# Patient Record
Sex: Female | Born: 1965 | Race: White | Hispanic: No | Marital: Married | Smoking: Never smoker
Health system: Southern US, Community
[De-identification: ages and names within clinical notes are randomized; demographics above are authoritative.]

## PROBLEM LIST (undated history)

## (undated) DIAGNOSIS — G4730 Sleep apnea, unspecified: Secondary | ICD-10-CM

## (undated) DIAGNOSIS — R609 Edema, unspecified: Secondary | ICD-10-CM

## (undated) DIAGNOSIS — J302 Other seasonal allergic rhinitis: Secondary | ICD-10-CM

## (undated) DIAGNOSIS — R112 Nausea with vomiting, unspecified: Secondary | ICD-10-CM

## (undated) DIAGNOSIS — F419 Anxiety disorder, unspecified: Secondary | ICD-10-CM

## (undated) DIAGNOSIS — F32A Depression, unspecified: Secondary | ICD-10-CM

## (undated) DIAGNOSIS — Z9889 Other specified postprocedural states: Secondary | ICD-10-CM

## (undated) DIAGNOSIS — F329 Major depressive disorder, single episode, unspecified: Secondary | ICD-10-CM

## (undated) DIAGNOSIS — M1990 Unspecified osteoarthritis, unspecified site: Secondary | ICD-10-CM

## (undated) HISTORY — PX: MENISCECTOMY: SHX123

## (undated) HISTORY — PX: BREAST BIOPSY: SHX20

## (undated) HISTORY — PX: WISDOM TOOTH EXTRACTION: SHX21

## (undated) HISTORY — PX: NOVASURE ABLATION: SHX5394

---

## 2007-09-20 ENCOUNTER — Encounter: Admission: RE | Admit: 2007-09-20 | Discharge: 2007-09-20 | Payer: Self-pay | Admitting: Obstetrics and Gynecology

## 2008-03-21 ENCOUNTER — Encounter: Admission: RE | Admit: 2008-03-21 | Discharge: 2008-03-21 | Payer: Self-pay | Admitting: Obstetrics and Gynecology

## 2012-12-31 ENCOUNTER — Encounter (HOSPITAL_COMMUNITY): Payer: Self-pay | Admitting: Pharmacist

## 2013-01-11 ENCOUNTER — Other Ambulatory Visit: Payer: Self-pay

## 2013-01-11 ENCOUNTER — Encounter (HOSPITAL_COMMUNITY)
Admission: RE | Admit: 2013-01-11 | Discharge: 2013-01-11 | Disposition: A | Discharge status: Home / Self Care | Payer: BC Managed Care – PPO | Source: Ambulatory Visit | Attending: Obstetrics and Gynecology | Admitting: Obstetrics and Gynecology

## 2013-01-11 ENCOUNTER — Encounter (HOSPITAL_COMMUNITY): Payer: Self-pay

## 2013-01-11 HISTORY — DX: Depression, unspecified: F32.A

## 2013-01-11 HISTORY — DX: Major depressive disorder, single episode, unspecified: F32.9

## 2013-01-11 HISTORY — DX: Anxiety disorder, unspecified: F41.9

## 2013-01-11 HISTORY — DX: Other specified postprocedural states: Z98.890

## 2013-01-11 HISTORY — DX: Other specified postprocedural states: R11.2

## 2013-01-11 HISTORY — DX: Edema, unspecified: R60.9

## 2013-01-11 HISTORY — DX: Sleep apnea, unspecified: G47.30

## 2013-01-11 LAB — CBC
HCT: 40.2 % (ref 36.0–46.0)
MCHC: 33.6 g/dL (ref 30.0–36.0)
MCV: 78.4 fL (ref 78.0–100.0)
Platelets: 243 10*3/uL (ref 150–400)
RDW: 13.6 % (ref 11.5–15.5)

## 2013-01-11 LAB — BASIC METABOLIC PANEL
Chloride: 99 mEq/L (ref 96–112)
Potassium: 3.3 mEq/L — ABNORMAL LOW (ref 3.5–5.1)
Sodium: 135 mEq/L (ref 135–145)

## 2013-01-11 MED ORDER — DEXTROSE 5 % IV SOLN
2.0000 g | INTRAVENOUS | Status: AC
  Administered 2013-01-12: 2 g via INTRAVENOUS
  Filled 2013-01-11: qty 2

## 2013-01-11 NOTE — Patient Instructions (Signed)
Your procedure is scheduled on:01/12/13  Enter through the Main Entrance at :6am Pick up desk phone and dial [phone] and inform us of your arrival.  Please call [phone] if you have any problems the morning of surgery.  Remember: Do not eat or drink after midnight:tonight   Take these meds the morning of surgery with a sip of water: usual am meds  DO NOT wear jewelry, eye make-up, lipstick,body lotion, or dark fingernail polish. Do not shave for 48 hours prior to surgery.   Patients discharged on the day of surgery will not be allowed to drive home.

## 2013-01-11 NOTE — H&P (Addendum)
47 yo with menorrhagia and fibroids presents for surgical mngt  PMHx:  Anxiety PSHx:  c-section, SVD x 2 SHx:  Negative tobacco All:  None Meds: cymbalta, xanex prn FHx:  DM, kidney dz, arthritis  AF, VSS Gen - NAD CV - RRR Lungs - clear Abd - soft, NT, ND PV - uterus mobile, NT.  No adnexal masses  [place]: multiple fibroids.  2.9cm anterior fibroid protruding into endometrial cavity  A/P:  Hysteroscopy, D&C w/ resection of fibroid and endometrial ablation R/b/a discussed, informed consent

## 2013-01-12 ENCOUNTER — Encounter (HOSPITAL_COMMUNITY): Payer: Self-pay | Admitting: Anesthesiology

## 2013-01-12 ENCOUNTER — Ambulatory Visit (HOSPITAL_COMMUNITY)
Admission: RE | Admit: 2013-01-12 | Discharge: 2013-01-12 | Disposition: A | Discharge status: Home / Self Care | Payer: BC Managed Care – PPO | Source: Ambulatory Visit | Attending: Obstetrics and Gynecology | Admitting: Obstetrics and Gynecology

## 2013-01-12 ENCOUNTER — Ambulatory Visit (HOSPITAL_COMMUNITY): Payer: BC Managed Care – PPO | Admitting: Anesthesiology

## 2013-01-12 ENCOUNTER — Encounter (HOSPITAL_COMMUNITY)
Admission: RE | Disposition: A | Discharge status: Home / Self Care | Payer: Self-pay | Source: Ambulatory Visit | Attending: Obstetrics and Gynecology

## 2013-01-12 ENCOUNTER — Encounter (HOSPITAL_COMMUNITY): Payer: Self-pay | Admitting: General Practice

## 2013-01-12 DIAGNOSIS — N920 Excessive and frequent menstruation with regular cycle: Secondary | ICD-10-CM | POA: Insufficient documentation

## 2013-01-12 DIAGNOSIS — D259 Leiomyoma of uterus, unspecified: Secondary | ICD-10-CM | POA: Insufficient documentation

## 2013-01-12 SURGERY — DILATATION & CURETTAGE/HYSTEROSCOPY WITH TRUCLEAR
Anesthesia: General | Site: Uterus | Wound class: Clean Contaminated

## 2013-01-12 MED ORDER — LIDOCAINE HCL 1 % IJ SOLN
INTRAMUSCULAR | Status: DC | PRN
  Administered 2013-01-12: 10 mL

## 2013-01-12 MED ORDER — EPHEDRINE SULFATE 50 MG/ML IJ SOLN
INTRAMUSCULAR | Status: DC | PRN
  Administered 2013-01-12: 5 mg via INTRAVENOUS
  Administered 2013-01-12 (×2): 10 mg via INTRAVENOUS

## 2013-01-12 MED ORDER — METOCLOPRAMIDE HCL 5 MG/ML IJ SOLN
INTRAMUSCULAR | Status: AC
  Filled 2013-01-12: qty 2

## 2013-01-12 MED ORDER — DEXAMETHASONE SODIUM PHOSPHATE 10 MG/ML IJ SOLN
INTRAMUSCULAR | Status: DC | PRN
  Administered 2013-01-12: 10 mg via INTRAVENOUS

## 2013-01-12 MED ORDER — METOCLOPRAMIDE HCL 5 MG/ML IJ SOLN
INTRAMUSCULAR | Status: DC | PRN
  Administered 2013-01-12: 10 mg via INTRAVENOUS

## 2013-01-12 MED ORDER — HYDROCODONE-ACETAMINOPHEN 5-500 MG PO TABS: 1.0000 | ORAL_TABLET | ORAL | Status: DC | PRN

## 2013-01-12 MED ORDER — FENTANYL CITRATE 0.05 MG/ML IJ SOLN
INTRAMUSCULAR | Status: DC | PRN
  Administered 2013-01-12: 100 ug via INTRAVENOUS

## 2013-01-12 MED ORDER — LACTATED RINGERS IV SOLN
INTRAVENOUS | Status: DC
  Administered 2013-01-12 (×3): via INTRAVENOUS

## 2013-01-12 MED ORDER — FENTANYL CITRATE 0.05 MG/ML IJ SOLN
INTRAMUSCULAR | Status: AC
  Administered 2013-01-12: 25 ug via INTRAVENOUS
  Filled 2013-01-12: qty 2

## 2013-01-12 MED ORDER — PHENYLEPHRINE 40 MCG/ML (10ML) SYRINGE FOR IV PUSH (FOR BLOOD PRESSURE SUPPORT)
PREFILLED_SYRINGE | INTRAVENOUS | Status: AC
  Filled 2013-01-12: qty 5

## 2013-01-12 MED ORDER — ONDANSETRON HCL 4 MG/2ML IJ SOLN
INTRAMUSCULAR | Status: DC | PRN
  Administered 2013-01-12: 4 mg via INTRAVENOUS

## 2013-01-12 MED ORDER — KETOROLAC TROMETHAMINE 30 MG/ML IJ SOLN
INTRAMUSCULAR | Status: AC
  Administered 2013-01-12: 30 mg via INTRAVENOUS
  Filled 2013-01-12: qty 1

## 2013-01-12 MED ORDER — EPHEDRINE 5 MG/ML INJ
INTRAVENOUS | Status: AC
  Filled 2013-01-12: qty 10

## 2013-01-12 MED ORDER — PROPOFOL 10 MG/ML IV EMUL
INTRAVENOUS | Status: DC | PRN
  Administered 2013-01-12: 180 mg via INTRAVENOUS

## 2013-01-12 MED ORDER — IBUPROFEN 200 MG PO TABS: 600.0000 mg | ORAL_TABLET | Freq: Four times a day (QID) | ORAL | Status: DC | PRN

## 2013-01-12 MED ORDER — PHENYLEPHRINE HCL 10 MG/ML IJ SOLN
INTRAMUSCULAR | Status: DC | PRN
  Administered 2013-01-12: 120 ug via INTRAVENOUS
  Administered 2013-01-12: 80 ug via INTRAVENOUS

## 2013-01-12 MED ORDER — MIDAZOLAM HCL 5 MG/5ML IJ SOLN
INTRAMUSCULAR | Status: DC | PRN
  Administered 2013-01-12: 2 mg via INTRAVENOUS

## 2013-01-12 MED ORDER — LIDOCAINE HCL (CARDIAC) 20 MG/ML IV SOLN
INTRAVENOUS | Status: AC
  Filled 2013-01-12: qty 5

## 2013-01-12 MED ORDER — PROPOFOL 10 MG/ML IV EMUL
INTRAVENOUS | Status: AC
  Filled 2013-01-12: qty 20

## 2013-01-12 MED ORDER — ONDANSETRON HCL 4 MG/2ML IJ SOLN
INTRAMUSCULAR | Status: AC
  Filled 2013-01-12: qty 2

## 2013-01-12 MED ORDER — SCOPOLAMINE 1 MG/3DAYS TD PT72
MEDICATED_PATCH | TRANSDERMAL | Status: AC
  Administered 2013-01-12: 1 via TRANSDERMAL
  Filled 2013-01-12: qty 1

## 2013-01-12 MED ORDER — SODIUM CHLORIDE 0.9 % IR SOLN
Status: DC | PRN
  Administered 2013-01-12 (×9): 3000 mL

## 2013-01-12 MED ORDER — FENTANYL CITRATE 0.05 MG/ML IJ SOLN
25.0000 ug | INTRAMUSCULAR | Status: DC | PRN
  Administered 2013-01-12: 25 ug via INTRAVENOUS

## 2013-01-12 MED ORDER — KETOROLAC TROMETHAMINE 30 MG/ML IJ SOLN: 30.0000 mg | Freq: Once | INTRAMUSCULAR | Status: AC

## 2013-01-12 MED ORDER — MIDAZOLAM HCL 2 MG/2ML IJ SOLN
INTRAMUSCULAR | Status: AC
  Filled 2013-01-12: qty 2

## 2013-01-12 MED ORDER — LIDOCAINE HCL (CARDIAC) 20 MG/ML IV SOLN
INTRAVENOUS | Status: DC | PRN
  Administered 2013-01-12: 50 mg via INTRAVENOUS

## 2013-01-12 MED ORDER — DEXAMETHASONE SODIUM PHOSPHATE 10 MG/ML IJ SOLN
INTRAMUSCULAR | Status: AC
  Filled 2013-01-12: qty 1

## 2013-01-12 MED ORDER — FENTANYL CITRATE 0.05 MG/ML IJ SOLN
INTRAMUSCULAR | Status: AC
  Filled 2013-01-12: qty 5

## 2013-01-12 SURGICAL SUPPLY — 22 items
BLADE INCISOR TRUC PLUS 2.9 (ABLATOR) IMPLANT
CANISTERS HI-FLOW 3000CC (CANNISTER) ×16 IMPLANT
CATH ROBINSON RED A/P 16FR (CATHETERS) ×2 IMPLANT
CLOTH BEACON ORANGE TIMEOUT ST (SAFETY) ×2 IMPLANT
CONTAINER PREFILL 10% NBF 60ML (FORM) ×4 IMPLANT
DRAPE HYSTEROSCOPY (DRAPE) ×2 IMPLANT
DRESSING TELFA 8X3 (GAUZE/BANDAGES/DRESSINGS) ×2 IMPLANT
ELECT REM PT RETURN 9FT ADLT (ELECTROSURGICAL) ×2
ELECTRODE REM PT RTRN 9FT ADLT (ELECTROSURGICAL) ×1 IMPLANT
GLOVE BIO SURGEON STRL SZ 6.5 (GLOVE) ×2 IMPLANT
GLOVE BIOGEL PI IND STRL 7.0 (GLOVE) ×1 IMPLANT
GLOVE BIOGEL PI INDICATOR 7.0 (GLOVE) ×1
GOWN STRL REIN XL XLG (GOWN DISPOSABLE) ×4 IMPLANT
INCISOR TRUC PLUS BLADE 2.9 (ABLATOR)
KIT HYSTEROSCOPY TRUCLEAR (ABLATOR) ×2 IMPLANT
MORCELLATOR RECIP TRUCLEAR 4.0 (ABLATOR) ×4 IMPLANT
NEEDLE SPNL 22GX3.5 QUINCKE BK (NEEDLE) ×2 IMPLANT
PACK VAGINAL MINOR WOMEN LF (CUSTOM PROCEDURE TRAY) ×2 IMPLANT
PAD OB MATERNITY 4.3X12.25 (PERSONAL CARE ITEMS) ×2 IMPLANT
SYR CONTROL 10ML LL (SYRINGE) ×2 IMPLANT
TOWEL OR 17X24 6PK STRL BLUE (TOWEL DISPOSABLE) ×4 IMPLANT
WATER STERILE IRR 1000ML POUR (IV SOLUTION) ×2 IMPLANT

## 2013-01-12 NOTE — Anesthesia Postprocedure Evaluation (Signed)
Anesthesia Post Note  Patient: Angelica Morgan  Procedure(s) Performed: Procedure(s) (LRB): DILATATION & CURETTAGE/HYSTEROSCOPY WITH TRUCLEAR (N/A)  Anesthesia type: General  Patient location: PACU  Post pain: Pain level controlled  Post assessment: Post-op Vital signs reviewed  Last Vitals:  Filed Vitals:   01/12/13 0945  BP: 103/53  Pulse:   Temp:     Post vital signs: Reviewed  Level of consciousness: sedated  Complications: No apparent anesthesia complications

## 2013-01-12 NOTE — Transfer of Care (Signed)
Immediate Anesthesia Transfer of Care Note  Patient: Angelica Morgan  Procedure(s) Performed: Procedure(s) with comments: DILATATION & CURETTAGE/HYSTEROSCOPY WITH TRUCLEAR (N/A) - with novasure  Immediate Anesthesia Transfer of Care Note  Patient: Angelica Morgan  Procedure(s) Performed: Procedure(s) with comments: DILATATION & CURETTAGE/HYSTEROSCOPY WITH TRUCLEAR (N/A) - with novasure  Patient Location: PACU  Anesthesia Type:General  Level of Consciousness: awake  Airway & Oxygen Therapy: Patient Spontanous Breathing  Post-op Assessment: Report given to PACU RN  Post vital signs: stable  Filed Vitals:   01/12/13 0606  BP: 104/71  Pulse: 79  Temp: 36.7 C    Complications: No apparent anesthesia complications

## 2013-01-12 NOTE — Anesthesia Preprocedure Evaluation (Addendum)
Anesthesia Evaluation  Patient identified by MRN, date of birth, ID band Patient awake    Reviewed: Allergy & Precautions, H&P , Patient's Chart, lab work & pertinent test results, reviewed documented beta blocker date and time   Airway Mallampati: II TM Distance: >3 FB Neck ROM: full    Dental no notable dental hx.    Pulmonary  breath sounds clear to auscultation  Pulmonary exam normal       Cardiovascular Rhythm:regular Rate:Normal     Neuro/Psych    GI/Hepatic   Endo/Other  Morbid obesity  Renal/GU      Musculoskeletal   Abdominal   Peds  Hematology   Anesthesia Other Findings No Dx of OSA; only snores w/o other sx of OSA  Reproductive/Obstetrics                          Anesthesia Physical Anesthesia Plan  ASA: III  Anesthesia Plan: General   Post-op Pain Management:    Induction: Intravenous  Airway Management Planned: LMA  Additional Equipment:   Intra-op Plan:   Post-operative Plan:   Informed Consent: I have reviewed the patients History and Physical, chart, labs and discussed the procedure including the risks, benefits and alternatives for the proposed anesthesia with the patient or authorized representative who has indicated his/her understanding and acceptance.   Dental Advisory Given  Plan Discussed with: CRNA and Surgeon  Anesthesia Plan Comments: (  Discussed  general anesthesia, including possible nausea, instrumentation of airway, sore throat,pulmonary aspiration, etc. I asked if the were any outstanding questions, or  concerns before we proceeded. )        Anesthesia Quick Evaluation

## 2013-01-12 NOTE — Op Note (Signed)
01/12/2013  9:39 AM  PATIENT:  Angelica Morgan  47 y.o. female  PRE-OPERATIVE DIAGNOSIS:  menorrhagia,fibroids cpt [phone]  POST-OPERATIVE DIAGNOSIS:  fibroids, menorrghia  PROCEDURE:  Procedure(s) with comments: DILATATION & CURETTAGE/HYSTEROSCOPY WITH TRUCLEAR (N/A) - with novasure  SURGEON:  Surgeon(s) and Role:    * Zelphia Cairo, MD - Primary     ANESTHESIA:   general  EBL:  Total I/O In: 1500 [I.V.:1500] Out: 110 [Urine:100; Blood:10]  Fluid deficit (NS):  1000cc  BLOOD ADMINISTERED:none  DRAINS: none   LOCAL MEDICATIONS USED:  LIDOCAINE   SPECIMEN:  Source of Specimen:  submucus fibroid  DISPOSITION OF SPECIMEN:  PATHOLOGY  COUNTS:  YES  TOURNIQUET:  * No tourniquets in log *  DICTATION: .Other Dictation: Dictation Number pending  PLAN OF CARE: Discharge to home after PACU  PATIENT DISPOSITION:  PACU - hemodynamically stable.   Delay start of Pharmacological VTE agent (>24hrs) due to surgical blood loss or risk of bleeding: no

## 2013-01-19 NOTE — Op Note (Signed)
NAMERACHELLE, EDWARDS NO.:  [id number]  MEDICAL RECORD NO.:  [id number]  LOCATION:  [facility]                          FACILITY:  [facility]  PHYSICIAN:  Zelphia Cairo, MD    DATE OF BIRTH:  05/12/1966  DATE OF PROCEDURE: DATE OF DISCHARGE:  01/12/2013                              OPERATIVE REPORT   PREOPERATIVE DIAGNOSES: 1. Menorrhagia. 2. Endometrial mass.  POSTOPERATIVE DIAGNOSES: 1. Menorrhagia. 2. Endometrial mass, pathology pending.  PROCEDURES: 1. Cervical block. 2. Hysteroscopy. 3. D and C. 4. Resection of endometrial mass.  SURGEON:  Zelphia Cairo, MD  ANESTHESIA:  General.  COMPLICATIONS:  None.  CONDITION: Stable to recovery room.  PROCEDURE:  The patient was taken to the operating room after informed consent was obtained.  She was placed in the dorsal lithotomy position using Allen stirrups.  She was prepped and draped in sterile fashion, and an in-and-out catheter was used to drain her bladder.  Bivalve speculum was placed in the vagina and a single-tooth tenaculum attached to the anterior lip of the cervix.  The cervical block was performed. The hysteroscope was inserted into the endometrial cavity and a survey was performed.  Large endometrial mass was noted to consume the entire endometrial cavity.  The mass did appear vascular and consistent with a submucous fibroid.  TRUCLEAR device was inserted through the hysteroscope and resection was performed.  The fibroid was very calcified and firm and the blade had difficulty with resection because the mass kept bouncing off of the blade.  As fluid deficit increased, we had to discontinue the procedure before the entire mass could be resected and therefore, endometrial ablation could not be performed.  All instruments were then removed from the uterus and the cervix.  The cervix was hemostatic.  The patient was taken to the recovery room in stable condition.  Sponge, lap, needle, and  instrument counts were correct x2.     Zelphia Cairo, MD     GA/MEDQ  D:  01/18/2013  T:  01/19/2013  Job:  [id number]

## 2013-04-06 ENCOUNTER — Encounter (HOSPITAL_COMMUNITY)
Admission: RE | Admit: 2013-04-06 | Discharge: 2013-04-06 | Disposition: A | Discharge status: Home / Self Care | Payer: BC Managed Care – PPO | Source: Ambulatory Visit | Attending: Obstetrics and Gynecology | Admitting: Obstetrics and Gynecology

## 2013-04-06 ENCOUNTER — Encounter (HOSPITAL_COMMUNITY): Payer: Self-pay

## 2013-04-06 DIAGNOSIS — Z01812 Encounter for preprocedural laboratory examination: Secondary | ICD-10-CM | POA: Insufficient documentation

## 2013-04-06 DIAGNOSIS — Z01818 Encounter for other preprocedural examination: Secondary | ICD-10-CM | POA: Insufficient documentation

## 2013-04-06 HISTORY — DX: Other seasonal allergic rhinitis: J30.2

## 2013-04-06 LAB — BASIC METABOLIC PANEL
BUN: 15 mg/dL (ref 6–23)
Chloride: 98 mEq/L (ref 96–112)
GFR calc Af Amer: >90 mL/min (ref >90)
Potassium: 3.2 mEq/L — ABNORMAL LOW (ref 3.5–5.1)
Sodium: 134 mEq/L — ABNORMAL LOW (ref 135–145)

## 2013-04-06 LAB — CBC
MCV: 78.3 fL (ref 78.0–100.0)
Platelets: 226 10*3/uL (ref 150–400)
RBC: 5.2 MIL/uL — ABNORMAL HIGH (ref 3.87–5.11)
WBC: 7.9 10*3/uL (ref 4.0–10.5)

## 2013-04-06 LAB — SURGICAL PCR SCREEN: Staphylococcus aureus: NEGATIVE

## 2013-04-06 NOTE — Patient Instructions (Addendum)
   Your procedure is scheduled on:  Wednesday, May 28  Enter through the Main Entrance of [facility] at: 6 am Pick up the phone at the desk and dial [phone] and inform us of your arrival.  Please call this number if you have any problems the morning of surgery: [phone]  Remember: Do not eat or drink after midnight:Tuesday. Take these medicines the morning of surgery with a SIP OF WATER:  Xanax, cymbalta, XYZA  With sip of water on day of surgery.  Do not take HCTZ per Dr Malen Gauze.  Do not wear jewelry, make-up, or FINGER nail polish No metal in your hair or on your body. Do not wear lotions, powders, perfumes. You may wear deodorant.  Please use your CHG wash as directed prior to surgery.  Do not shave anywhere for at least 12 hours prior to first CHG shower.  Do not bring valuables to the hospital. Contacts, dentures or bridgework may not be worn into surgery.  Leave suitcase in the car. After Surgery it may be brought to your room. For patients being admitted to the hospital, checkout time is 11:00am the day of discharge.  Patients discharged on the day of surgery will not be allowed to drive home.  Home with husband Ty.

## 2013-04-06 NOTE — H&P (Addendum)
47 yo with menorrhagia and fibroids presents for surgical mngt.  Pt underwent hysteroscoy w/ partial resection of submucus fibroid.  Symptoms have not improved and she declines another trial of resetion - desires definitive mngt.  PMHx: Anxiety  PSHx: c-section, SVD x 2, hysteroscopy w/ trueclear resection SHx: Negative tobacco  All: None  Meds: cymbalta, xanex prn, HCTZ, magnesium, green tea pill,  Allergy med FHx: DM, kidney dz, arthritis  AF, VSS  Gen - NAD  CV - RRR  Lungs - clear  Abd - soft, NT, ND  PV - uterus mobile, NT. No adnexal masses  [place]: multiple fibroids. 2.9cm anterior fibroid protruding into endometrial cavity  A/P: menorrhagia - plan for LAVH R/b/a discussed, informed consent Plan of care reviewed, questions answered. Informed consent

## 2013-04-12 MED ORDER — DEXTROSE 5 % IV SOLN
2.0000 g | INTRAVENOUS | Status: AC
  Administered 2013-04-13: 2 g via INTRAVENOUS
  Filled 2013-04-12: qty 2

## 2013-04-13 ENCOUNTER — Ambulatory Visit (HOSPITAL_COMMUNITY): Payer: BC Managed Care – PPO | Admitting: Anesthesiology

## 2013-04-13 ENCOUNTER — Encounter (HOSPITAL_COMMUNITY): Payer: Self-pay | Admitting: Anesthesiology

## 2013-04-13 ENCOUNTER — Encounter (HOSPITAL_COMMUNITY)
Admission: RE | Disposition: A | Discharge status: Home / Self Care | Payer: Self-pay | Source: Ambulatory Visit | Attending: Obstetrics and Gynecology

## 2013-04-13 ENCOUNTER — Observation Stay (HOSPITAL_COMMUNITY)
Admission: RE | Admit: 2013-04-13 | Discharge: 2013-04-14 | Disposition: A | Discharge status: Home / Self Care | Payer: BC Managed Care – PPO | Source: Ambulatory Visit | Attending: Obstetrics and Gynecology | Admitting: Obstetrics and Gynecology

## 2013-04-13 DIAGNOSIS — D250 Submucous leiomyoma of uterus: Secondary | ICD-10-CM | POA: Insufficient documentation

## 2013-04-13 DIAGNOSIS — N920 Excessive and frequent menstruation with regular cycle: Principal | ICD-10-CM | POA: Insufficient documentation

## 2013-04-13 HISTORY — PX: LAPAROSCOPIC ASSISTED VAGINAL HYSTERECTOMY: SHX5398

## 2013-04-13 LAB — BASIC METABOLIC PANEL
CO2: 24 mEq/L (ref 19–32)
Chloride: 102 mEq/L (ref 96–112)
Potassium: 3.5 mEq/L (ref 3.5–5.1)
Sodium: 136 mEq/L (ref 135–145)

## 2013-04-13 SURGERY — HYSTERECTOMY, VAGINAL, LAPAROSCOPY-ASSISTED
Anesthesia: General | Site: Abdomen | Wound class: Clean Contaminated

## 2013-04-13 MED ORDER — PROMETHAZINE HCL 25 MG/ML IJ SOLN: 6.2500 mg | INTRAMUSCULAR | Status: DC | PRN

## 2013-04-13 MED ORDER — FENTANYL CITRATE 0.05 MG/ML IJ SOLN
INTRAMUSCULAR | Status: AC
  Filled 2013-04-13: qty 5

## 2013-04-13 MED ORDER — HYDROMORPHONE HCL PF 1 MG/ML IJ SOLN
INTRAMUSCULAR | Status: AC
  Administered 2013-04-13: 0.5 mg via INTRAVENOUS
  Filled 2013-04-13: qty 1

## 2013-04-13 MED ORDER — OXYCODONE-ACETAMINOPHEN 5-325 MG PO TABS
1.0000 | ORAL_TABLET | ORAL | Status: DC | PRN
  Filled 2013-04-13: qty 2
  Filled 2013-04-13: qty 1
  Filled 2013-04-13: qty 2

## 2013-04-13 MED ORDER — FENTANYL CITRATE 0.05 MG/ML IJ SOLN
INTRAMUSCULAR | Status: AC
  Filled 2013-04-13: qty 2

## 2013-04-13 MED ORDER — ONDANSETRON HCL 4 MG PO TABS: 4.0000 mg | ORAL_TABLET | Freq: Four times a day (QID) | ORAL | Status: DC | PRN

## 2013-04-13 MED ORDER — KETOROLAC TROMETHAMINE 30 MG/ML IJ SOLN: 15.0000 mg | Freq: Once | INTRAMUSCULAR | Status: DC | PRN

## 2013-04-13 MED ORDER — MEPERIDINE HCL 25 MG/ML IJ SOLN: 6.2500 mg | INTRAMUSCULAR | Status: DC | PRN

## 2013-04-13 MED ORDER — SCOPOLAMINE 1 MG/3DAYS TD PT72
1.0000 | MEDICATED_PATCH | TRANSDERMAL | Status: DC
  Administered 2013-04-13: 1.5 mg via TRANSDERMAL

## 2013-04-13 MED ORDER — LIDOCAINE HCL (CARDIAC) 20 MG/ML IV SOLN
INTRAVENOUS | Status: AC
  Filled 2013-04-13: qty 5

## 2013-04-13 MED ORDER — ONDANSETRON HCL 4 MG/2ML IJ SOLN
INTRAMUSCULAR | Status: AC
  Filled 2013-04-13: qty 2

## 2013-04-13 MED ORDER — PROPOFOL 10 MG/ML IV EMUL
INTRAVENOUS | Status: AC
  Filled 2013-04-13: qty 20

## 2013-04-13 MED ORDER — 0.9 % SODIUM CHLORIDE (POUR BTL) OPTIME
TOPICAL | Status: DC | PRN
  Administered 2013-04-13: 1000 mL

## 2013-04-13 MED ORDER — DULOXETINE HCL 30 MG PO CPEP
30.0000 mg | ORAL_CAPSULE | Freq: Every day | ORAL | Status: DC
  Filled 2013-04-13: qty 1

## 2013-04-13 MED ORDER — ONDANSETRON HCL 4 MG/2ML IJ SOLN: 4.0000 mg | Freq: Four times a day (QID) | INTRAMUSCULAR | Status: DC | PRN

## 2013-04-13 MED ORDER — KETOROLAC TROMETHAMINE 30 MG/ML IJ SOLN
INTRAMUSCULAR | Status: AC
  Filled 2013-04-13: qty 1

## 2013-04-13 MED ORDER — HYDROMORPHONE HCL PF 1 MG/ML IJ SOLN
1.0000 mg | INTRAMUSCULAR | Status: DC | PRN
  Administered 2013-04-13 (×3): 1 mg via INTRAVENOUS
  Filled 2013-04-13 (×3): qty 1

## 2013-04-13 MED ORDER — HYDROCHLOROTHIAZIDE 25 MG PO TABS
25.0000 mg | ORAL_TABLET | Freq: Every day | ORAL | Status: DC
  Filled 2013-04-13 (×2): qty 1

## 2013-04-13 MED ORDER — ROCURONIUM BROMIDE 100 MG/10ML IV SOLN
INTRAVENOUS | Status: DC | PRN
  Administered 2013-04-13: 10 mg via INTRAVENOUS
  Administered 2013-04-13: 5 mg via INTRAVENOUS
  Administered 2013-04-13: 10 mg via INTRAVENOUS
  Administered 2013-04-13: 45 mg via INTRAVENOUS
  Administered 2013-04-13: 10 mg via INTRAVENOUS

## 2013-04-13 MED ORDER — ONDANSETRON HCL 4 MG/2ML IJ SOLN
INTRAMUSCULAR | Status: DC | PRN
  Administered 2013-04-13: 4 mg via INTRAVENOUS

## 2013-04-13 MED ORDER — KETOROLAC TROMETHAMINE 30 MG/ML IJ SOLN
INTRAMUSCULAR | Status: DC | PRN
  Administered 2013-04-13: 30 mg via INTRAMUSCULAR

## 2013-04-13 MED ORDER — GLYCOPYRROLATE 0.2 MG/ML IJ SOLN
INTRAMUSCULAR | Status: DC | PRN
  Administered 2013-04-13: .6 mg via INTRAVENOUS

## 2013-04-13 MED ORDER — GLYCOPYRROLATE 0.2 MG/ML IJ SOLN
INTRAMUSCULAR | Status: AC
  Filled 2013-04-13: qty 2

## 2013-04-13 MED ORDER — DEXAMETHASONE SODIUM PHOSPHATE 4 MG/ML IJ SOLN
INTRAMUSCULAR | Status: DC | PRN
  Administered 2013-04-13: 10 mg via INTRAVENOUS

## 2013-04-13 MED ORDER — MIDAZOLAM HCL 5 MG/5ML IJ SOLN
INTRAMUSCULAR | Status: DC | PRN
  Administered 2013-04-13: 2 mg via INTRAVENOUS

## 2013-04-13 MED ORDER — SCOPOLAMINE 1 MG/3DAYS TD PT72
MEDICATED_PATCH | TRANSDERMAL | Status: AC
  Filled 2013-04-13: qty 1

## 2013-04-13 MED ORDER — LIDOCAINE HCL (CARDIAC) 20 MG/ML IV SOLN
INTRAVENOUS | Status: DC | PRN
  Administered 2013-04-13: 80 mg via INTRAVENOUS

## 2013-04-13 MED ORDER — PROPOFOL 10 MG/ML IV BOLUS
INTRAVENOUS | Status: DC | PRN
  Administered 2013-04-13: 200 mg via INTRAVENOUS

## 2013-04-13 MED ORDER — NEOSTIGMINE METHYLSULFATE 1 MG/ML IJ SOLN
INTRAMUSCULAR | Status: AC
  Filled 2013-04-13: qty 1

## 2013-04-13 MED ORDER — BUPIVACAINE HCL (PF) 0.25 % IJ SOLN
INTRAMUSCULAR | Status: DC | PRN
  Administered 2013-04-13: 5 mL

## 2013-04-13 MED ORDER — BUPIVACAINE HCL (PF) 0.25 % IJ SOLN
INTRAMUSCULAR | Status: AC
  Filled 2013-04-13: qty 30

## 2013-04-13 MED ORDER — LACTATED RINGERS IR SOLN
Status: DC | PRN
  Administered 2013-04-13: 3000 mL

## 2013-04-13 MED ORDER — KETOROLAC TROMETHAMINE 30 MG/ML IJ SOLN
30.0000 mg | Freq: Three times a day (TID) | INTRAMUSCULAR | Status: DC
  Administered 2013-04-13 – 2013-04-14 (×2): 30 mg via INTRAVENOUS
  Filled 2013-04-13 (×2): qty 1

## 2013-04-13 MED ORDER — GLYCOPYRROLATE 0.2 MG/ML IJ SOLN
INTRAMUSCULAR | Status: AC
  Filled 2013-04-13: qty 1

## 2013-04-13 MED ORDER — IBUPROFEN 600 MG PO TABS
600.0000 mg | ORAL_TABLET | Freq: Four times a day (QID) | ORAL | Status: DC | PRN
  Administered 2013-04-14: 600 mg via ORAL
  Filled 2013-04-13: qty 1

## 2013-04-13 MED ORDER — ROCURONIUM BROMIDE 50 MG/5ML IV SOLN
INTRAVENOUS | Status: AC
  Filled 2013-04-13: qty 1

## 2013-04-13 MED ORDER — DEXTROSE IN LACTATED RINGERS 5 % IV SOLN
INTRAVENOUS | Status: DC
  Administered 2013-04-13: 19:00:00 via INTRAVENOUS

## 2013-04-13 MED ORDER — MIDAZOLAM HCL 2 MG/2ML IJ SOLN
INTRAMUSCULAR | Status: AC
  Filled 2013-04-13: qty 2

## 2013-04-13 MED ORDER — DEXAMETHASONE SODIUM PHOSPHATE 10 MG/ML IJ SOLN
INTRAMUSCULAR | Status: AC
  Filled 2013-04-13: qty 1

## 2013-04-13 MED ORDER — NEOSTIGMINE METHYLSULFATE 1 MG/ML IJ SOLN
INTRAMUSCULAR | Status: DC | PRN
  Administered 2013-04-13: 3 mg via INTRAVENOUS

## 2013-04-13 MED ORDER — OXYCODONE-ACETAMINOPHEN 5-325 MG PO TABS
1.0000 | ORAL_TABLET | ORAL | Status: DC | PRN
  Administered 2013-04-13: 2 via ORAL
  Administered 2013-04-14: 1 via ORAL
  Administered 2013-04-14: 2 via ORAL

## 2013-04-13 MED ORDER — LACTATED RINGERS IV SOLN
INTRAVENOUS | Status: DC
  Administered 2013-04-13 (×4): via INTRAVENOUS

## 2013-04-13 MED ORDER — FENTANYL CITRATE 0.05 MG/ML IJ SOLN
INTRAMUSCULAR | Status: DC | PRN
  Administered 2013-04-13 (×5): 50 ug via INTRAVENOUS

## 2013-04-13 MED ORDER — HYDROMORPHONE HCL PF 1 MG/ML IJ SOLN
0.2500 mg | INTRAMUSCULAR | Status: DC | PRN
  Administered 2013-04-13: 0.5 mg via INTRAVENOUS

## 2013-04-13 MED ORDER — KETOROLAC TROMETHAMINE 30 MG/ML IJ SOLN: 30.0000 mg | Freq: Three times a day (TID) | INTRAMUSCULAR | Status: DC

## 2013-04-13 MED ORDER — MENTHOL 3 MG MT LOZG: 1.0000 | LOZENGE | OROMUCOSAL | Status: DC | PRN

## 2013-04-13 SURGICAL SUPPLY — 35 items
CABLE HIGH FREQUENCY MONO STRZ (ELECTRODE) IMPLANT
CANISTER SUCTION 2500CC (MISCELLANEOUS) ×2 IMPLANT
CHLORAPREP W/TINT 26ML (MISCELLANEOUS) ×4 IMPLANT
CLOTH BEACON ORANGE TIMEOUT ST (SAFETY) ×2 IMPLANT
COVER TABLE BACK 60X90 (DRAPES) ×2 IMPLANT
DECANTER SPIKE VIAL GLASS SM (MISCELLANEOUS) ×2 IMPLANT
DERMABOND ADVANCED (GAUZE/BANDAGES/DRESSINGS) ×2
DERMABOND ADVANCED .7 DNX12 (GAUZE/BANDAGES/DRESSINGS) ×2 IMPLANT
ELECT REM PT RETURN 9FT ADLT (ELECTROSURGICAL) ×2
ELECTRODE REM PT RTRN 9FT ADLT (ELECTROSURGICAL) ×1 IMPLANT
GLOVE BIO SURGEON STRL SZ 6.5 (GLOVE) ×6 IMPLANT
GLOVE BIOGEL PI IND STRL 6.5 (GLOVE) ×2 IMPLANT
GLOVE BIOGEL PI IND STRL 7.0 (GLOVE) ×2 IMPLANT
GLOVE BIOGEL PI INDICATOR 6.5 (GLOVE) ×2
GLOVE BIOGEL PI INDICATOR 7.0 (GLOVE) ×2
GOWN STRL REIN XL XLG (GOWN DISPOSABLE) ×10 IMPLANT
NS IRRIG 1000ML POUR BTL (IV SOLUTION) ×2 IMPLANT
PACK LAVH (CUSTOM PROCEDURE TRAY) ×2 IMPLANT
PROTECTOR NERVE ULNAR (MISCELLANEOUS) ×2 IMPLANT
SEALER TISSUE G2 CVD JAW 45CM (ENDOMECHANICALS) ×2 IMPLANT
SET IRRIG TUBING LAPAROSCOPIC (IRRIGATION / IRRIGATOR) ×2 IMPLANT
SUT MNCRL 0 MO-4 VIOLET 18 CR (SUTURE) ×2 IMPLANT
SUT MON AB 2-0 CT1 36 (SUTURE) ×2 IMPLANT
SUT MONOCRYL 0 MO 4 18  CR/8 (SUTURE) ×2
SUT VIC AB 3-0 PS2 18 (SUTURE) ×1
SUT VIC AB 3-0 PS2 18XBRD (SUTURE) ×1 IMPLANT
SUT VICRYL 0 TIES 12 18 (SUTURE) ×2 IMPLANT
SUT VICRYL 0 UR6 27IN ABS (SUTURE) ×2 IMPLANT
TOWEL OR 17X24 6PK STRL BLUE (TOWEL DISPOSABLE) ×4 IMPLANT
TRAY FOLEY CATH 14FR (SET/KITS/TRAYS/PACK) ×2 IMPLANT
TROCAR OPTI TIP 5M 100M (ENDOMECHANICALS) ×2 IMPLANT
TROCAR XCEL NON-BLD 11X100MML (ENDOMECHANICALS) ×2 IMPLANT
TROCAR XCEL OPT SLVE 5M 100M (ENDOMECHANICALS) IMPLANT
WARMER LAPAROSCOPE (MISCELLANEOUS) ×2 IMPLANT
WATER STERILE IRR 1000ML POUR (IV SOLUTION) IMPLANT

## 2013-04-13 NOTE — Anesthesia Preprocedure Evaluation (Signed)
Anesthesia Evaluation  Patient identified by MRN, date of birth, ID band Patient awake    Reviewed: Allergy & Precautions, H&P , NPO status , Patient's Chart, lab work & pertinent test results  History of Anesthesia Complications (+) PONV  Airway Mallampati: II TM Distance: >3 FB Neck ROM: full    Dental no notable dental hx. (+) Teeth Intact   Pulmonary neg pulmonary ROS,    Pulmonary exam normal       Cardiovascular negative cardio ROS      Neuro/Psych negative neurological ROS  negative psych ROS   GI/Hepatic negative GI ROS, Neg liver ROS,   Endo/Other  negative endocrine ROS  Renal/GU negative Renal ROS  negative genitourinary   Musculoskeletal negative musculoskeletal ROS (+)   Abdominal Normal abdominal exam  (+)   Peds negative pediatric ROS (+)  Hematology negative hematology ROS (+)   Anesthesia Other Findings   Reproductive/Obstetrics negative OB ROS                           Anesthesia Physical Anesthesia Plan  ASA: II  Anesthesia Plan: General   Post-op Pain Management:    Induction: Intravenous  Airway Management Planned: Oral ETT  Additional Equipment:   Intra-op Plan:   Post-operative Plan: Extubation in OR  Informed Consent: I have reviewed the patients History and Physical, chart, labs and discussed the procedure including the risks, benefits and alternatives for the proposed anesthesia with the patient or authorized representative who has indicated his/her understanding and acceptance.   Dental Advisory Given  Plan Discussed with: CRNA and Surgeon  Anesthesia Plan Comments:         Anesthesia Quick Evaluation

## 2013-04-13 NOTE — Progress Notes (Signed)
Pt was evaluated at noon.  Pain was controlled with IV meds.  VS, I/O reviewed Gen - sedated Abd - soft, NT/ND Inc - c/d/i  A/P  Continue postop care

## 2013-04-13 NOTE — Transfer of Care (Signed)
Immediate Anesthesia Transfer of Care Note  Patient: Angelica Morgan  Procedure(s) Performed: Procedure(s): LAPAROSCOPIC ASSISTED VAGINAL HYSTERECTOMY (N/A)  Patient Location: PACU  Anesthesia Type:General  Level of Consciousness: awake, alert , oriented and patient cooperative  Airway & Oxygen Therapy: Patient Spontanous Breathing and Patient connected to nasal cannula oxygen  Post-op Assessment: Report given to PACU RN and Post -op Vital signs reviewed and stable  Post vital signs: Reviewed and stable  Complications: No apparent anesthesia complications

## 2013-04-13 NOTE — Preoperative (Signed)
Beta Blockers   Reason not to administer Beta Blockers:Not Applicable 

## 2013-04-14 ENCOUNTER — Encounter (HOSPITAL_COMMUNITY): Payer: Self-pay | Admitting: Obstetrics and Gynecology

## 2013-04-14 LAB — CBC
MCHC: 32.1 g/dL (ref 30.0–36.0)
Platelets: 214 10*3/uL (ref 150–400)
RDW: 14.2 % (ref 11.5–15.5)

## 2013-04-14 LAB — BASIC METABOLIC PANEL
GFR calc Af Amer: >90 mL/min (ref >90)
GFR calc non Af Amer: >90 mL/min (ref >90)
Potassium: 4.5 mEq/L (ref 3.5–5.1)
Sodium: 139 mEq/L (ref 135–145)

## 2013-04-14 MED ORDER — OXYCODONE-ACETAMINOPHEN 5-325 MG PO TABS: 1.0000 | ORAL_TABLET | ORAL | Status: DC | PRN

## 2013-04-14 MED ORDER — IBUPROFEN 600 MG PO TABS: 600.0000 mg | ORAL_TABLET | Freq: Four times a day (QID) | ORAL | Status: DC | PRN

## 2013-04-14 NOTE — Discharge Summary (Signed)
Physician Discharge Summary  Patient ID: Angelica Morgan MRN: [id number] DOB/AGE: Dec 21, 1965 47 y.o.  Admit date: 04/13/2013 Discharge date: 04/14/2013  Admission Diagnoses:  Menorrhagia, fibroids  Discharge Diagnoses:  Active Problems:   * No active hospital problems. *   Discharged Condition: good  Hospital Course: Pt was admitted for postop care.  Initially her pain was controlled with IV pain meds.  When she was able to tolerate PO, her diet was advanced and she was able to tolerate PO pain meds.  Her foley catheter was discontinued and she was ambulating and urinating without difficulty.  She remained afebrile and postop Hgb was stable.  She was d/c home on POD 1 with prescriptions for percocet and motrin  Consults: None  Significant Diagnostic Studies: labs: CBC  Treatments: IV hydration  Discharge Exam: Blood pressure 117/69, pulse 62, temperature 98.5 F (36.9 C), temperature source Oral, resp. rate 18, height 5\' 5"  (1.651 m), weight 101.152 kg (223 lb), SpO2 99.00%. General appearance: alert and cooperative Resp: clear to auscultation bilaterally Cardio: regular rate and rhythm GI: soft, non-tender; bowel sounds normal; no masses,  no organomegaly Incision/Wound:  Clean and intact  Disposition: 01-Home or Self Care     Medication List    STOP taking these medications       ALPRAZolam 0.5 MG tablet  Commonly known as:  XANAX     zolpidem 5 MG tablet  Commonly known as:  AMBIEN      TAKE these medications       Cranberry 450 MG Caps  Take 1 capsule by mouth 2 (two) times daily.     DULoxetine 30 MG capsule  Commonly known as:  CYMBALTA  Take 30 mg by mouth daily.     Glucosamine 500 MG Caps  Take 1 capsule by mouth daily.     Green Tea 200 MG Caps  Take 2 capsules by mouth 2 (two) times daily.     hydrochlorothiazide 25 MG tablet  Commonly known as:  HYDRODIURIL  Take 25 mg by mouth daily.     ibuprofen 600 MG tablet  Commonly known as:   ADVIL,MOTRIN  Take 1 tablet (600 mg total) by mouth every 6 (six) hours as needed.     levocetirizine 5 MG tablet  Commonly known as:  XYZAL  Take 5 mg by mouth every evening.     multivitamin with minerals Tabs  Take 1 tablet by mouth daily.     oxyCODONE-acetaminophen 5-325 MG per tablet  Commonly known as:  PERCOCET/ROXICET  Take 1-2 tablets by mouth every 4 (four) hours as needed.           Follow-up Information   Schedule an appointment as soon as possible for a visit in 2 weeks to follow up.      Signed: [name] 04/14/2013, 8:18 AM

## 2013-04-14 NOTE — Anesthesia Postprocedure Evaluation (Signed)
  Anesthesia Post-op Note  Patient: Angelica Morgan  Procedure(s) Performed: Procedure(s): LAPAROSCOPIC ASSISTED VAGINAL HYSTERECTOMY (N/A) Patient is awake and responsive. Pain and nausea are reasonably well controlled. Vital signs are stable and clinically acceptable. Oxygen saturation is clinically acceptable. There are no apparent anesthetic complications at this time. Patient is ready for discharge.

## 2013-04-29 NOTE — Op Note (Signed)
Angelica Morgan, Angelica Morgan               ACCOUNT NO.:  [id number]  MEDICAL RECORD NO.:  [id number]  LOCATION:  9302                          FACILITY:  [facility]  PHYSICIAN:  Zelphia Cairo, MD    DATE OF BIRTH:  12-Aug-1966  DATE OF PROCEDURE:  04/13/2013 DATE OF DISCHARGE:  04/14/2013                              OPERATIVE REPORT   PREOPERATIVE DIAGNOSIS:  Menorrhagia.  POSTOPERATIVE DIAGNOSIS:  Menorrhagia.  PROCEDURE:  Laparoscopic assisted vaginal hysterectomy.  SURGEON:  Zelphia Cairo, MD.  ASSISTANT:  Juluis Mire, M.D..  SPECIMEN:  Uterus with cervix.  COMPLICATIONS:  None.  ANESTHESIA:  General.  CONDITION:  Stable to recovery room.  DESCRIPTION OF PROCEDURE:  The patient was taken to the operating room. After informed consent was obtained.  She was given general anesthesia. Placed in the dorsal lithotomy position using Allen stirrups.  She was prepped and draped in sterile fashion and a Foley catheter was then inserted.  Bivalve speculum was placed in the vagina, single-tooth tenaculum placed on anterior lip of the cervix.  The Hulka clamp was placed on the cervix.  Tenaculum and speculum was removed and our attention was turned to the abdomen.  Local anesthesia was used at the site of our infraumbilical and suprapubic incisions.  Infraumbilical skin incision was then made with a scalpel and extended bluntly to the level of the fascia using a Kelly clamp.  Optical trocar was then inserted under direct visualization.  Once intraperitoneal placement was confirmed, CO2 was turned on and a survey of the abdomen and pelvis was performed.  The uterus was then manipulated to the patient's left side. Right adnexa, fallopian tube and ovary appeared normal.  Uterus was manipulated to the patient's right side and the left adnexa appeared normal.  A suprapubic incision was then made with a scalpel and a 5 mm trocar was inserted under direct visualization.  Blunt probe  was inserted.  The uterus was manipulated to the patient's left.  An EnSeal device was used to cauterize and cut the right fallopian tube and utero- ovarian ligament.  Excellent hemostasis was assured, and this incision was extended down the level of the broad ligament using the EnSeal device staying just adjacent to the uterus.  The round ligament was then grasped, cauterized, and cut using the EnSeal device.  Hemostasis was assured and the procedure was performed on the opposite side of the uterus.  At this time, instruments were removed from the abdomen and our attention was turned to the vagina.  The [place] clamp was removed from the cervix and a weighted speculum was placed in the posterior fornix.  A Deaver was placed anterior and a circumferential incision was made around the cervix.  The posterior cul- de-sac was then entered sharply using curved Mayo scissors and a long weighted speculum was inserted.  Uterosacral ligaments were then grasped with curved Haney cut and suture ligated.  Hemostasis was assured.  The cardinal ligaments were then grasped bilaterally with curved Haney's, cut, and suture ligated.  The vesicouterine peritoneum was dissected off the anterior cervix and the anterior cul-de-sac was entered sharply with curved Mayo scissors.  The Deaver was placed anteriorly.  The  remaining uterine pedicles were then grasped with Haney clamps, cut, and suture ligated.  The fundus of the uterus was then grasped with a thyroid tenaculum and delivered through the posterior cul-de-sac.  The remaining pedicles were grasped with curved Haney clamps, and cut free ties were used to suture ligate and the uterus was handed off and passed off to be sent to pathology.  Once hemostasis was assured, the posterior cuff was reapproximated in a running locked fashion, and the remainder of the vaginal cuff was closed using interrupted figure-of-eight stitches. Once hemostasis was assured, all  instruments were removed from the vagina and our attention was turned to the abdomen.  The abdomen was re- insufflated and the laparoscope was reinserted, all pedicles were reinspected and found to be hemostatic.  All trocars and instruments were then removed from the abdomen.  The deep stitch was placed in the infraumbilical skin incision, and the skin was closed with Vicryl.  For both the suprapubic and infraumbilical skin incision, Dermabond was placed over both incisions.  The patient was then extubated and taken to the recovery room in stable condition.  Sponge, lap, needle, and instrument counts were correct x2.     Zelphia Cairo, MD     GA/MEDQ  D:  04/28/2013  T:  04/29/2013  Job:  [id number]

## 2015-07-30 ENCOUNTER — Other Ambulatory Visit: Payer: Self-pay | Admitting: Obstetrics and Gynecology

## 2015-07-31 LAB — CYTOLOGY - PAP

## 2015-08-02 ENCOUNTER — Other Ambulatory Visit: Payer: Self-pay | Admitting: Obstetrics and Gynecology

## 2015-08-02 DIAGNOSIS — R928 Other abnormal and inconclusive findings on diagnostic imaging of breast: Secondary | ICD-10-CM

## 2015-08-08 ENCOUNTER — Ambulatory Visit
Admission: RE | Admit: 2015-08-08 | Discharge: 2015-08-08 | Disposition: A | Discharge status: Home / Self Care | Payer: BLUE CROSS/BLUE SHIELD | Source: Ambulatory Visit | Attending: Obstetrics and Gynecology | Admitting: Obstetrics and Gynecology

## 2015-08-08 DIAGNOSIS — R928 Other abnormal and inconclusive findings on diagnostic imaging of breast: Secondary | ICD-10-CM

## 2015-08-09 ENCOUNTER — Other Ambulatory Visit: Payer: Self-pay | Admitting: Obstetrics and Gynecology

## 2015-08-09 DIAGNOSIS — N6001 Solitary cyst of right breast: Secondary | ICD-10-CM

## 2015-08-16 ENCOUNTER — Ambulatory Visit
Admission: RE | Admit: 2015-08-16 | Discharge: 2015-08-16 | Disposition: A | Discharge status: Home / Self Care | Payer: BLUE CROSS/BLUE SHIELD | Source: Ambulatory Visit | Attending: Obstetrics and Gynecology | Admitting: Obstetrics and Gynecology

## 2015-08-16 ENCOUNTER — Other Ambulatory Visit: Payer: Self-pay | Admitting: Obstetrics and Gynecology

## 2015-08-16 DIAGNOSIS — N6001 Solitary cyst of right breast: Secondary | ICD-10-CM

## 2016-01-30 ENCOUNTER — Other Ambulatory Visit: Payer: Self-pay | Admitting: Obstetrics and Gynecology

## 2016-01-30 DIAGNOSIS — N6001 Solitary cyst of right breast: Secondary | ICD-10-CM

## 2016-01-30 DIAGNOSIS — N63 Unspecified lump in unspecified breast: Secondary | ICD-10-CM

## 2016-02-13 ENCOUNTER — Emergency Department (HOSPITAL_BASED_OUTPATIENT_CLINIC_OR_DEPARTMENT_OTHER)
Admission: EM | Admit: 2016-02-13 | Discharge: 2016-02-14 | Disposition: A | Discharge status: Home / Self Care | Payer: BLUE CROSS/BLUE SHIELD | Attending: Emergency Medicine | Admitting: Emergency Medicine

## 2016-02-13 ENCOUNTER — Emergency Department (HOSPITAL_BASED_OUTPATIENT_CLINIC_OR_DEPARTMENT_OTHER): Payer: BLUE CROSS/BLUE SHIELD

## 2016-02-13 ENCOUNTER — Encounter (HOSPITAL_BASED_OUTPATIENT_CLINIC_OR_DEPARTMENT_OTHER): Payer: Self-pay | Admitting: Emergency Medicine

## 2016-02-13 DIAGNOSIS — R0789 Other chest pain: Secondary | ICD-10-CM | POA: Diagnosis not present

## 2016-02-13 DIAGNOSIS — Z79899 Other long term (current) drug therapy: Secondary | ICD-10-CM | POA: Insufficient documentation

## 2016-02-13 DIAGNOSIS — Z8669 Personal history of other diseases of the nervous system and sense organs: Secondary | ICD-10-CM | POA: Insufficient documentation

## 2016-02-13 DIAGNOSIS — F329 Major depressive disorder, single episode, unspecified: Secondary | ICD-10-CM | POA: Insufficient documentation

## 2016-02-13 DIAGNOSIS — R0602 Shortness of breath: Secondary | ICD-10-CM | POA: Diagnosis present

## 2016-02-13 DIAGNOSIS — F419 Anxiety disorder, unspecified: Secondary | ICD-10-CM | POA: Insufficient documentation

## 2016-02-13 LAB — BASIC METABOLIC PANEL
Anion gap: 11 (ref 5–15)
BUN: 21 mg/dL — AB (ref 6–20)
CO2: 24 mmol/L (ref 22–32)
CREATININE: 0.85 mg/dL (ref 0.44–1.00)
Calcium: 8.9 mg/dL (ref 8.9–10.3)
Chloride: 105 mmol/L (ref 101–111)
GFR calc Af Amer: >60 mL/min (ref >60)
GLUCOSE: 122 mg/dL — AB (ref 65–99)
Potassium: 3.3 mmol/L — ABNORMAL LOW (ref 3.5–5.1)
SODIUM: 140 mmol/L (ref 135–145)

## 2016-02-13 LAB — CBC
HCT: 39.2 % (ref 36.0–46.0)
HEMOGLOBIN: 13.3 g/dL (ref 12.0–15.0)
MCH: 27.7 pg (ref 26.0–34.0)
MCHC: 33.9 g/dL (ref 30.0–36.0)
MCV: 81.5 fL (ref 78.0–100.0)
PLATELETS: 243 10*3/uL (ref 150–400)
RBC: 4.81 MIL/uL (ref 3.87–5.11)
RDW: 13 % (ref 11.5–15.5)
WBC: 8.2 10*3/uL (ref 4.0–10.5)

## 2016-02-13 LAB — D-DIMER, QUANTITATIVE (NOT AT ARMC)

## 2016-02-13 LAB — TROPONIN I

## 2016-02-13 NOTE — ED Provider Notes (Signed)
CSN: [id number]     Arrival date & time 02/13/16  2019 History   First MD Initiated Contact with Patient 02/13/16 2211     Chief Complaint  Patient presents with  . Shortness of Breath     (Consider location/radiation/quality/duration/timing/severity/associated sxs/prior Treatment) The history is provided by the patient.     Pt presents with chest tightness and SOB that began around 6pm,  The pain was worse with sitting down and better with walking around and going outside. Tightness was initially 10/10 and is now 7/10.  This occurred while she was doing routine household chores after dinner.  She had eaten a frozen dinner and took many pills including latuda, ibuprofen, fish oil, and glucosamine.  PT has a hx depression, has been on 4 different medications this month but has stopped each because of side effects.  PT was concerned because this feels like described symptoms her father had when he had a "blood clot in his lungs that went to his heart and caused a heart attack."   Past Medical History  Diagnosis Date  . Edema     legs and feet  . Anxiety   . Depression   . Sleep apnea   . PONV (postoperative nausea and vomiting)   . SVD (spontaneous vaginal delivery)     x 2  . Seasonal allergies    Past Surgical History  Procedure Laterality Date  . Cesarean section      x 1  . Wisdom tooth extraction    . Novasure ablation    . Laparoscopic assisted vaginal hysterectomy N/A 04/13/2013    Procedure: LAPAROSCOPIC ASSISTED VAGINAL HYSTERECTOMY;  Surgeon: Marylynn Pearson, MD;  Location: [facility];  Service: Gynecology;  Laterality: N/A;   History reviewed. No pertinent family history. Social History  Substance Use Topics  . Smoking status: Never Smoker   . Smokeless tobacco: Never Used  . Alcohol Use: Yes     Comment: socially   OB History    No data available     Review of Systems  All other systems reviewed and are negative.     Allergies  Anesthetic  Home  Medications   Prior to Admission medications   Medication Sig Start Date End Date Taking? Authorizing Provider  ALPRAZolam (XANAX XR) 0.5 MG 24 hr tablet Take 0.5 mg by mouth daily.   Yes Historical Provider, MD  Lurasidone HCl (LATUDA) 60 MG TABS Take by mouth.   Yes Historical Provider, MD  Cranberry 450 MG CAPS Take 1 capsule by mouth 2 (two) times daily.    Historical Provider, MD  DULoxetine (CYMBALTA) 30 MG capsule Take 30 mg by mouth daily.    Historical Provider, MD  Glucosamine 500 MG CAPS Take 1 capsule by mouth daily.    Historical Provider, MD  Nyoka Cowden Tea 200 MG CAPS Take 2 capsules by mouth 2 (two) times daily.    Historical Provider, MD  hydrochlorothiazide (HYDRODIURIL) 25 MG tablet Take 25 mg by mouth daily.    Historical Provider, MD  ibuprofen (ADVIL,MOTRIN) 600 MG tablet Take 1 tablet (600 mg total) by mouth every 6 (six) hours as needed. 04/14/13   Marylynn Pearson, MD  levocetirizine (XYZAL) 5 MG tablet Take 5 mg by mouth every evening.    Historical Provider, MD  Multiple Vitamin (MULTIVITAMIN WITH MINERALS) TABS Take 1 tablet by mouth daily.    Historical Provider, MD  oxyCODONE-acetaminophen (PERCOCET/ROXICET) 5-325 MG per tablet Take 1-2 tablets by mouth every 4 (four) hours as  needed. 04/14/13   Marylynn Pearson, MD   BP 103/57 mmHg  Pulse 72  Temp(Src) 97.7 F (36.5 C) (Oral)  Resp 16  Ht 5\' 5"  (1.651 m)  Wt 95.255 kg  BMI 34.95 kg/m2  SpO2 97%  LMP 03/17/2013 Physical Exam  Constitutional: She appears well-developed and well-nourished. No distress.  HENT:  Head: Normocephalic and atraumatic.  Neck: Neck supple.  Cardiovascular: Normal rate and regular rhythm.   Pulmonary/Chest: Effort normal and breath sounds normal. No respiratory distress. She has no wheezes. She has no rales.  Abdominal: Soft. She exhibits no distension. There is no tenderness. There is no rebound and no guarding.  Musculoskeletal: She exhibits no edema.  Bilateral lower extremities  without edema or tenderness   Neurological: She is alert.  Skin: She is not diaphoretic.  Nursing note and vitals reviewed.   ED Course  Procedures (including critical care time) Labs Review Labs Reviewed  BASIC METABOLIC PANEL - Abnormal; Notable for the following:    Potassium 3.3 (*)    Glucose, Bld 122 (*)    BUN 21 (*)    All other components within normal limits  CBC  TROPONIN I  D-DIMER, QUANTITATIVE (NOT AT [facility])  TROPONIN I    Imaging Review Dg Chest 2 View  02/13/2016  CLINICAL DATA:  50 year old female with chest pain EXAM: CHEST  2 VIEW COMPARISON:  None. FINDINGS: The heart size and mediastinal contours are within normal limits. Both lungs are clear. The visualized skeletal structures are unremarkable. IMPRESSION: No active cardiopulmonary disease. Electronically Signed   By: Anner Crete M.D.   On: 02/13/2016 22:46   I have personally reviewed and evaluated these images and lab results as part of my medical decision-making.   EKG Interpretation   Date/Time:  Wednesday February 13 2016 20:46:48 EDT Ventricular Rate:  75 PR Interval:  144 QRS Duration: 86 QT Interval:  408 QTC Calculation: 455 R Axis:   59 Text Interpretation:  Normal sinus rhythm Right atrial enlargement  Borderline ECG No significant change since last tracing Confirmed by  JACUBOWITZ  MD, SAM [id number]) on 02/13/2016 8:50:30 PM      MDM   Final diagnoses:  Atypical chest pain    Afebrile, nontoxic patient with atypical chest pain.  HEART score is 3 if pt is given full score for HLD and DM, though she is "borderline."  She is low risk.  Well's PE score is zero.  Unable to [facility] given age.  Workup reassuring.  Pt feeling better.  D/C home with close PCP follow up.  Discussed result, findings, treatment, and follow up  with patient.  Pt given return precautions.  Pt verbalizes understanding and agrees with plan.       I doubt any other EMC precluding discharge at this time including, but not  necessarily limited to the following:  ACS, PE, aortic dissection, pneuothorax, pneumonia.     Clayton Bibles, PA-C 02/14/16 0128  Orlie Dakin, MD 02/15/16 1450

## 2016-02-13 NOTE — ED Notes (Addendum)
Patient states that she is having chest tightness and trouble catching her breath. This started about 1 hour ago. The patient reports that she is having the same symptoms as her dad when he had a heart attack. Patient is very anxious in triage, crying when asked about health history and allergies. The patient is holding onto her husband hand while asking questions and getting BP. Patient reports that she just started the [place] about 1 week ago and taken off Cymbalta

## 2016-02-13 NOTE — ED Notes (Addendum)
Nurse first-pt anxious however no resp distress- O2 sat 98% RA, HR 80, RR 18

## 2016-02-13 NOTE — ED Notes (Signed)
Nurse first-pt c/o feeling SOB and requests to go outside-pt NAD-advised triage RN would be calling her next for triage and to stay in ED WR

## 2016-02-13 NOTE — ED Notes (Signed)
Patient transported to X-ray 

## 2016-02-13 NOTE — ED Notes (Signed)
Pt returned from xray

## 2016-02-13 NOTE — ED Notes (Signed)
Pt c/o SOB today where she felt like she was not getting a deep enough breath because her chest felt tight.  Pt does not have any cardiac or respiratory health problems, she does not appear to be in acute distress and is able to speak in full sentences without issue.

## 2016-02-14 LAB — TROPONIN I: Troponin I: <0.03 ng/mL (ref <0.031)

## 2016-02-14 MED ORDER — POTASSIUM CHLORIDE CRYS ER 20 MEQ PO TBCR
40.0000 meq | EXTENDED_RELEASE_TABLET | Freq: Once | ORAL | Status: AC
  Administered 2016-02-14: 40 meq via ORAL
  Filled 2016-02-14: qty 2

## 2016-02-14 NOTE — ED Provider Notes (Signed)
Developed anterior chest tightness 6 PM today while doing laundry. Symptoms accompanied by shortness of breath. Symptoms improved after she walked around went outside to get fresh air. She is presently asymptomatic. No nausea or sweatiness. Cardiac risk factors include family history, "borderline" diabetes" borderline" high cholesterol. On exam alert no distress lungs clear auscultation heart regular rate and rhythm abdomen nondistended nontender extremities edema. Heart score equals 3 based on risk factors and age, Chest x-ray viewed by me  Orlie Dakin, MD 02/14/16 [phone]

## 2016-02-14 NOTE — Discharge Instructions (Signed)
Read the information below.  You may return to the Emergency Department at any time for worsening condition or any new symptoms that concern you.  If you develop worsening chest pain, shortness of breath, fever, you pass out, or become weak or dizzy, return to the ER for a recheck.     Your caregiver has diagnosed you as having chest pain that is not specific for one problem, but does not require admission.  You are at low risk for an acute heart condition or other serious illness. Chest pain comes from many different causes.  SEEK IMMEDIATE MEDICAL ATTENTION IF: You have severe chest pain, especially if the pain is crushing or pressure-like and spreads to the arms, back, neck, or jaw, or if you have sweating, nausea (feeling sick to your stomach), or shortness of breath. THIS IS AN EMERGENCY. Don't wait to see if the pain will go away. Get medical help at once. Call 911 or 0 (operator). DO NOT drive yourself to the hospital.  Your chest pain gets worse and does not go away with rest.  You have an attack of chest pain lasting longer than usual, despite rest and treatment with the medications your caregiver has prescribed.  You wake from sleep with chest pain or shortness of breath.  You feel dizzy or faint.  You have chest pain not typical of your usual pain for which you originally saw your caregive   Nonspecific Chest Pain  Chest pain can be caused by many different conditions. There is always a chance that your pain could be related to something serious, such as a heart attack or a blood clot in your lungs. Chest pain can also be caused by conditions that are not life-threatening. If you have chest pain, it is very important to follow up with your health care provider. CAUSES  Chest pain can be caused by:  Heartburn.  Pneumonia or bronchitis.  Anxiety or stress.  Inflammation around your heart (pericarditis) or lung (pleuritis or pleurisy).  A blood clot in your lung.  A collapsed lung  (pneumothorax). It can develop suddenly on its own (spontaneous pneumothorax) or from trauma to the chest.  Shingles infection (varicella-zoster virus).  Heart attack.  Damage to the bones, muscles, and cartilage that make up your chest wall. This can include:  Bruised bones due to injury.  Strained muscles or cartilage due to frequent or repeated coughing or overwork.  Fracture to one or more ribs.  Sore cartilage due to inflammation (costochondritis). RISK FACTORS  Risk factors for chest pain may include:  Activities that increase your risk for trauma or injury to your chest.  Respiratory infections or conditions that cause frequent coughing.  Medical conditions or overeating that can cause heartburn.  Heart disease or family history of heart disease.  Conditions or health behaviors that increase your risk of developing a blood clot.  Having had chicken pox (varicella zoster). SIGNS AND SYMPTOMS Chest pain can feel like:  Burning or tingling on the surface of your chest or deep in your chest.  Crushing, pressure, aching, or squeezing pain.  Dull or sharp pain that is worse when you move, cough, or take a deep breath.  Pain that is also felt in your back, neck, shoulder, or arm, or pain that spreads to any of these areas. Your chest pain may come and go, or it may stay constant. DIAGNOSIS Lab tests or other studies may be needed to find the cause of your pain. Your health care  provider may have you take a test called an ambulatory ECG (electrocardiogram). An ECG records your heartbeat patterns at the time the test is performed. You may also have other tests, such as:  Transthoracic echocardiogram (TTE). During echocardiography, sound waves are used to create a picture of all of the heart structures and to look at how blood flows through your heart.  Transesophageal echocardiogram (TEE).This is a more advanced imaging test that obtains images from inside your body. It  allows your health care provider to see your heart in finer detail.  Cardiac monitoring. This allows your health care provider to monitor your heart rate and rhythm in real time.  Holter monitor. This is a portable device that records your heartbeat and can help to diagnose abnormal heartbeats. It allows your health care provider to track your heart activity for several days, if needed.  Stress tests. These can be done through exercise or by taking medicine that makes your heart beat more quickly.  Blood tests.  Imaging tests. TREATMENT  Your treatment depends on what is causing your chest pain. Treatment may include:  Medicines. These may include:  Acid blockers for heartburn.  Anti-inflammatory medicine.  Pain medicine for inflammatory conditions.  Antibiotic medicine, if an infection is present.  Medicines to dissolve blood clots.  Medicines to treat coronary artery disease.  Supportive care for conditions that do not require medicines. This may include:  Resting.  Applying heat or cold packs to injured areas.  Limiting activities until pain decreases. HOME CARE INSTRUCTIONS  If you were prescribed an antibiotic medicine, finish it all even if you start to feel better.  Avoid any activities that bring on chest pain.  Do not use any tobacco products, including cigarettes, chewing tobacco, or electronic cigarettes. If you need help quitting, ask your health care provider.  Do not drink alcohol.  Take medicines only as directed by your health care provider.  Keep all follow-up visits as directed by your health care provider. This is important. This includes any further testing if your chest pain does not go away.  If heartburn is the cause for your chest pain, you may be told to keep your head raised (elevated) while sleeping. This reduces the chance that acid will go from your stomach into your esophagus.  Make lifestyle changes as directed by your health care  provider. These may include:  Getting regular exercise. Ask your health care provider to suggest some activities that are safe for you.  Eating a heart-healthy diet. A registered dietitian can help you to learn healthy eating options.  Maintaining a healthy weight.  Managing diabetes, if necessary.  Reducing stress. SEEK MEDICAL CARE IF:  Your chest pain does not go away after treatment.  You have a rash with blisters on your chest.  You have a fever. SEEK IMMEDIATE MEDICAL CARE IF:   Your chest pain is worse.  You have an increasing cough, or you cough up blood.  You have severe abdominal pain.  You have severe weakness.  You faint.  You have chills.  You have sudden, unexplained chest discomfort.  You have sudden, unexplained discomfort in your arms, back, neck, or jaw.  You have shortness of breath at any time.  You suddenly start to sweat, or your skin gets clammy.  You feel nauseous or you vomit.  You suddenly feel light-headed or dizzy.  Your heart begins to beat quickly, or it feels like it is skipping beats. These symptoms may represent a serious  problem that is an emergency. Do not wait to see if the symptoms will go away. Get medical help right away. Call your local emergency services (911 in the [place] Do not drive yourself to the hospital.   This information is not intended to replace advice given to you by your health care provider. Make sure you discuss any questions you have with your health care provider.   Document Released: 08/13/2005 Document Revised: 11/24/2014 Document Reviewed: 06/09/2014 Elsevier Interactive Patient Education [employer].

## 2016-02-14 NOTE — ED Notes (Signed)
Pt verbalizes understanding of d/c instructions and denies any further needs at this time. 

## 2016-02-14 NOTE — ED Notes (Signed)
MD at bedside. 

## 2016-03-04 ENCOUNTER — Ambulatory Visit
Admission: RE | Admit: 2016-03-04 | Discharge: 2016-03-04 | Disposition: A | Discharge status: Home / Self Care | Payer: BLUE CROSS/BLUE SHIELD | Source: Ambulatory Visit | Attending: Obstetrics and Gynecology | Admitting: Obstetrics and Gynecology

## 2016-03-04 DIAGNOSIS — N6001 Solitary cyst of right breast: Secondary | ICD-10-CM

## 2016-12-14 IMAGING — MG MM DIAG BREAST TOMO UNI RIGHT
4 series · 4 of 12 positions shown · non-contrast
Comparison: Previous exam(s).

CLINICAL DATA: Right breast asymmetry seen on most recent screening
mammography.

EXAM:
DIGITAL DIAGNOSTIC RIGHT MAMMOGRAM WITH 3D TOMOSYNTHESIS
ULTRASOUND RIGHT BREAST

[R MLO]
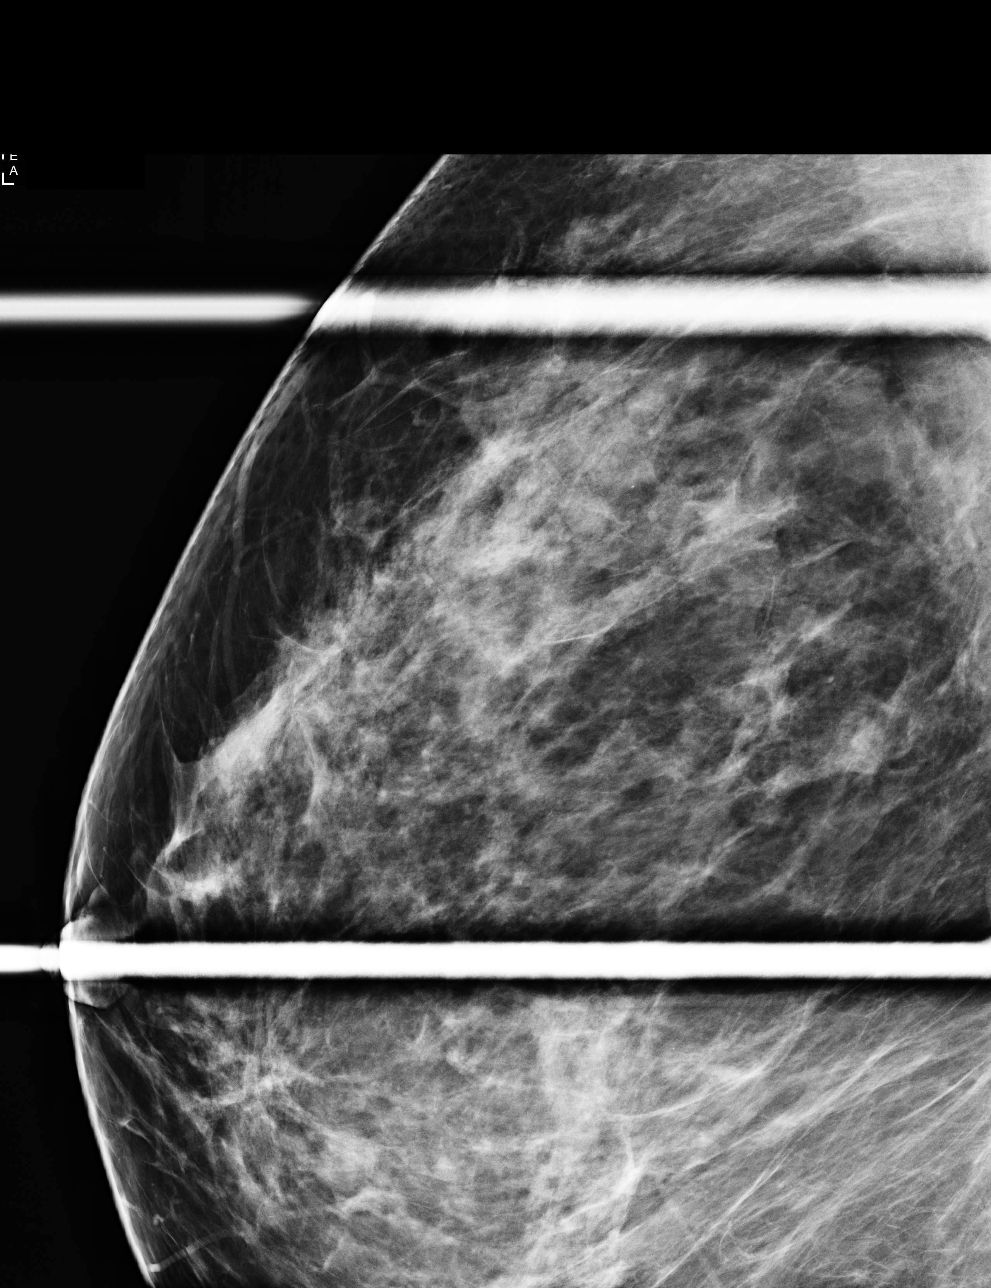

[R CC]
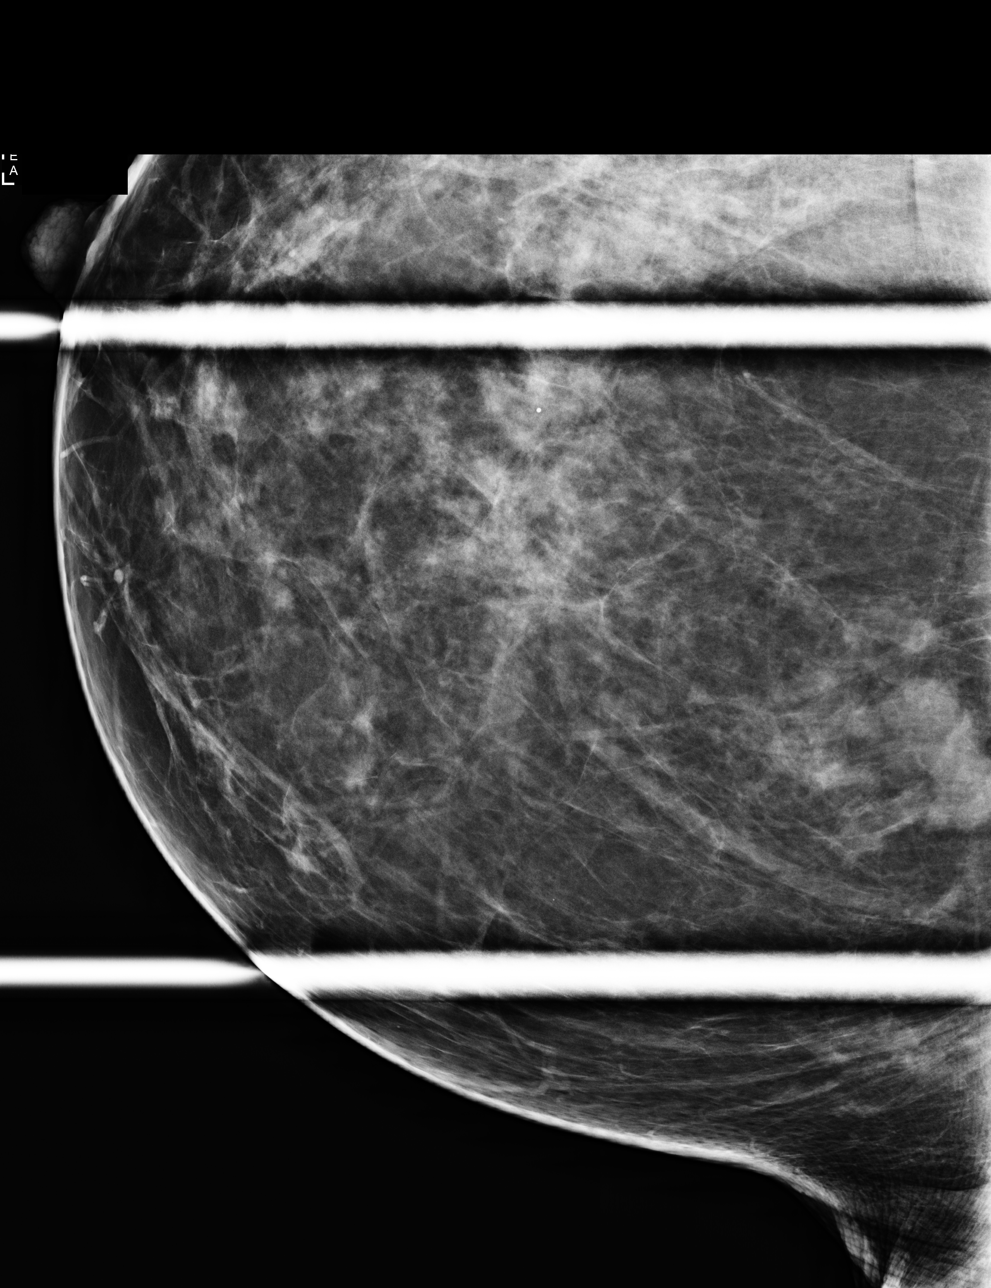

[R MLO tomo · tomo slice 45/89.0]
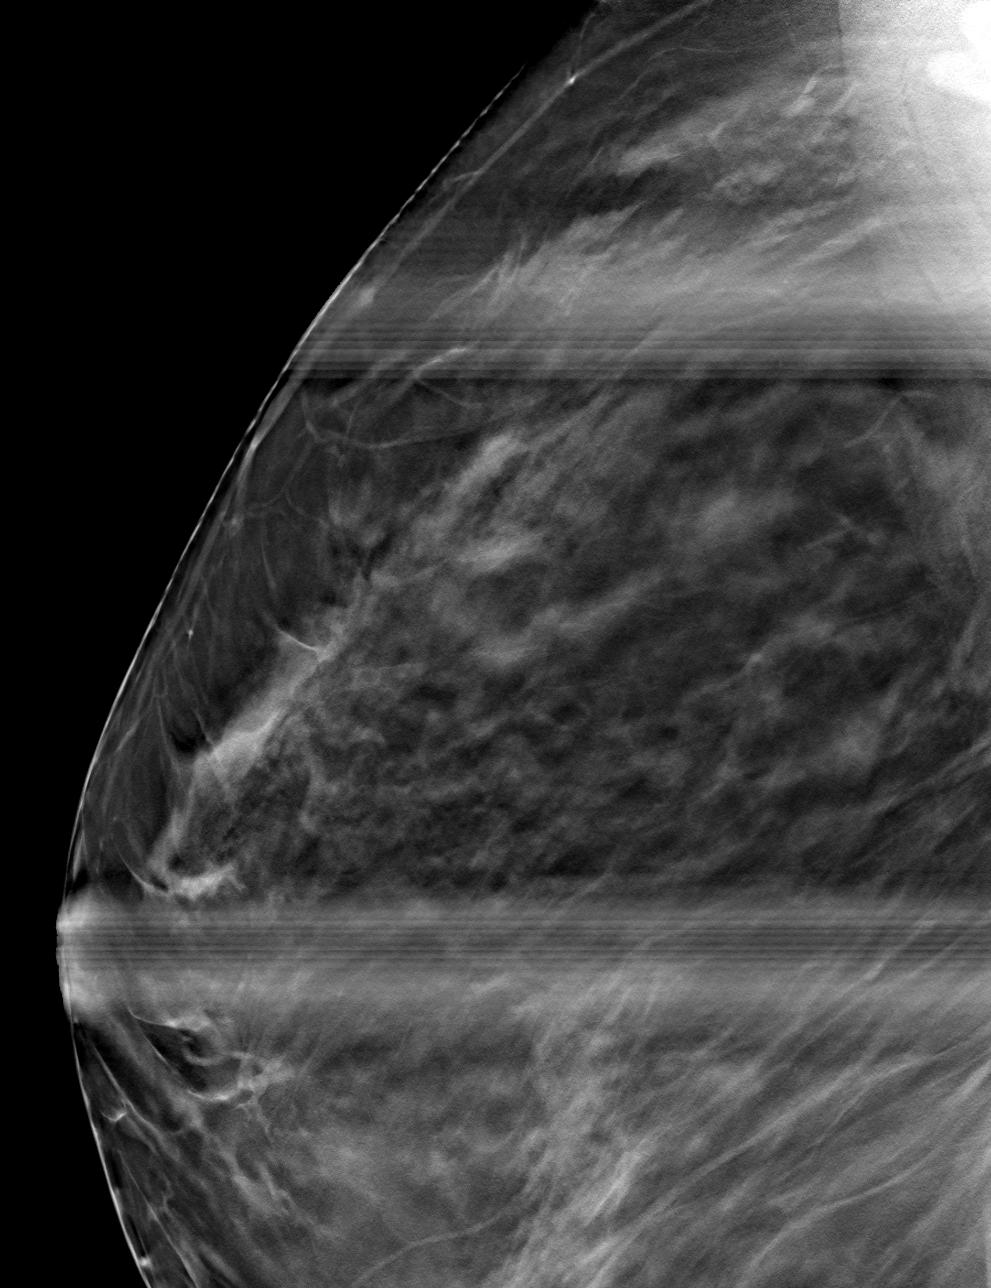

[R CC tomo · tomo slice 43/86.0]
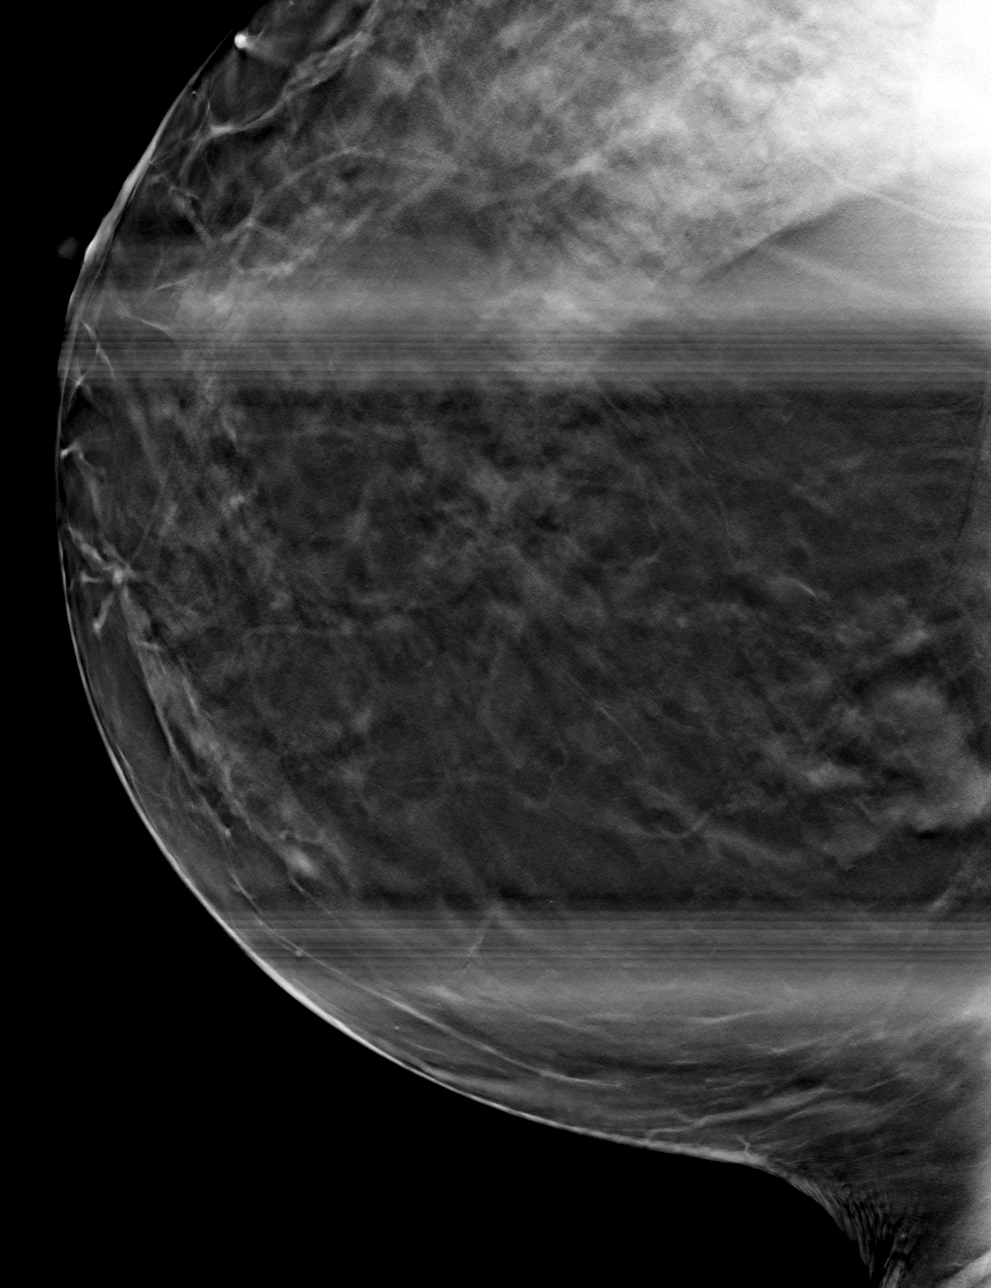

[4 of 12 positions shown; findings below may reference images not displayed]

ACR Breast Density Category c: The breast tissue is heterogeneously
dense, which may obscure small masses.
FINDINGS: There is a persistent asymmetry in the right medial breast,
posterior depth, seen best on the craniocaudal view. According to 3D
tomographic images it is located between 1 and 3 o'clock in the
right breast.

Mammographic images were processed with CAD.

On physical exam, no suspicious masses are found.

Targeted ultrasound is performed, showing no sonographic correlation
to the right medial breast asymmetry. In the right breast 10 o'clock
9 cm from the nipple there is a 1.7 cm septated benign-appearing
cyst.
IMPRESSION: Right medial breast, posterior depth, asymmetry seen on the
craniocaudal view, without sonographic correlation, for which
stereotactic 3D guided biopsy is recommended.

Right breast 10 o'clock benign-appearing septated cyst.

RECOMMENDATION:
Stereotactic biopsy of the right breast.

I have discussed the findings and recommendations with the patient.
Results were also provided in writing at the conclusion of the
visit. If applicable, a reminder letter will be sent to the patient
regarding the next appointment.

BI-RADS CATEGORY  4: Suspicious.

## 2017-01-20 DIAGNOSIS — G4709 Other insomnia: Secondary | ICD-10-CM | POA: Diagnosis not present

## 2017-01-20 DIAGNOSIS — F3342 Major depressive disorder, recurrent, in full remission: Secondary | ICD-10-CM | POA: Diagnosis not present

## 2017-03-06 DIAGNOSIS — N390 Urinary tract infection, site not specified: Secondary | ICD-10-CM | POA: Diagnosis not present

## 2017-03-19 DIAGNOSIS — H1010 Acute atopic conjunctivitis, unspecified eye: Secondary | ICD-10-CM | POA: Diagnosis not present

## 2017-04-16 DIAGNOSIS — M19071 Primary osteoarthritis, right ankle and foot: Secondary | ICD-10-CM | POA: Diagnosis not present

## 2017-04-16 DIAGNOSIS — M792 Neuralgia and neuritis, unspecified: Secondary | ICD-10-CM | POA: Diagnosis not present

## 2017-04-16 DIAGNOSIS — M898X9 Other specified disorders of bone, unspecified site: Secondary | ICD-10-CM | POA: Diagnosis not present

## 2017-04-16 DIAGNOSIS — G5791 Unspecified mononeuropathy of right lower limb: Secondary | ICD-10-CM | POA: Diagnosis not present

## 2017-05-06 DIAGNOSIS — N952 Postmenopausal atrophic vaginitis: Secondary | ICD-10-CM | POA: Diagnosis not present

## 2017-06-10 DIAGNOSIS — M19071 Primary osteoarthritis, right ankle and foot: Secondary | ICD-10-CM | POA: Diagnosis not present

## 2017-06-10 DIAGNOSIS — M2011 Hallux valgus (acquired), right foot: Secondary | ICD-10-CM | POA: Diagnosis not present

## 2017-06-10 DIAGNOSIS — M792 Neuralgia and neuritis, unspecified: Secondary | ICD-10-CM | POA: Diagnosis not present

## 2017-07-14 DIAGNOSIS — F411 Generalized anxiety disorder: Secondary | ICD-10-CM | POA: Diagnosis not present

## 2017-07-14 DIAGNOSIS — F3342 Major depressive disorder, recurrent, in full remission: Secondary | ICD-10-CM | POA: Diagnosis not present

## 2017-09-08 DIAGNOSIS — M79671 Pain in right foot: Secondary | ICD-10-CM | POA: Diagnosis not present

## 2017-09-08 DIAGNOSIS — M19071 Primary osteoarthritis, right ankle and foot: Secondary | ICD-10-CM | POA: Diagnosis not present

## 2017-09-10 DIAGNOSIS — M19071 Primary osteoarthritis, right ankle and foot: Secondary | ICD-10-CM | POA: Diagnosis not present

## 2017-09-10 DIAGNOSIS — M6701 Short Achilles tendon (acquired), right ankle: Secondary | ICD-10-CM | POA: Diagnosis not present

## 2017-09-10 DIAGNOSIS — M2011 Hallux valgus (acquired), right foot: Secondary | ICD-10-CM | POA: Diagnosis not present

## 2017-10-05 DIAGNOSIS — Z6838 Body mass index (BMI) 38.0-38.9, adult: Secondary | ICD-10-CM | POA: Diagnosis not present

## 2017-10-05 DIAGNOSIS — Z01419 Encounter for gynecological examination (general) (routine) without abnormal findings: Secondary | ICD-10-CM | POA: Diagnosis not present

## 2017-10-05 DIAGNOSIS — Z1231 Encounter for screening mammogram for malignant neoplasm of breast: Secondary | ICD-10-CM | POA: Diagnosis not present

## 2017-10-05 DIAGNOSIS — Z13820 Encounter for screening for osteoporosis: Secondary | ICD-10-CM | POA: Diagnosis not present

## 2017-10-29 DIAGNOSIS — L718 Other rosacea: Secondary | ICD-10-CM | POA: Diagnosis not present

## 2017-11-30 DIAGNOSIS — M19071 Primary osteoarthritis, right ankle and foot: Secondary | ICD-10-CM | POA: Diagnosis not present

## 2017-12-24 DIAGNOSIS — L700 Acne vulgaris: Secondary | ICD-10-CM | POA: Diagnosis not present

## 2017-12-24 DIAGNOSIS — L718 Other rosacea: Secondary | ICD-10-CM | POA: Diagnosis not present

## 2018-01-05 DIAGNOSIS — F411 Generalized anxiety disorder: Secondary | ICD-10-CM | POA: Diagnosis not present

## 2018-01-05 DIAGNOSIS — F3342 Major depressive disorder, recurrent, in full remission: Secondary | ICD-10-CM | POA: Diagnosis not present

## 2018-01-18 DIAGNOSIS — M19072 Primary osteoarthritis, left ankle and foot: Secondary | ICD-10-CM | POA: Diagnosis not present

## 2018-01-18 DIAGNOSIS — M7661 Achilles tendinitis, right leg: Secondary | ICD-10-CM | POA: Diagnosis not present

## 2018-01-18 DIAGNOSIS — M19071 Primary osteoarthritis, right ankle and foot: Secondary | ICD-10-CM | POA: Diagnosis not present

## 2018-03-23 DIAGNOSIS — L700 Acne vulgaris: Secondary | ICD-10-CM | POA: Diagnosis not present

## 2018-04-26 DIAGNOSIS — M19072 Primary osteoarthritis, left ankle and foot: Secondary | ICD-10-CM | POA: Diagnosis not present

## 2018-04-26 DIAGNOSIS — M19071 Primary osteoarthritis, right ankle and foot: Secondary | ICD-10-CM | POA: Diagnosis not present

## 2018-06-29 DIAGNOSIS — F3342 Major depressive disorder, recurrent, in full remission: Secondary | ICD-10-CM | POA: Diagnosis not present

## 2018-06-29 DIAGNOSIS — F900 Attention-deficit hyperactivity disorder, predominantly inattentive type: Secondary | ICD-10-CM | POA: Diagnosis not present

## 2018-10-13 NOTE — Progress Notes (Signed)
Angelica Morgan received her flu shot today to her LT deltoid at the [facility] by the undersigned.  Lot#[id number] NDC: [id number] Mfg: Janeann Merl Exp: 05/17/19

## 2018-10-25 DIAGNOSIS — M19072 Primary osteoarthritis, left ankle and foot: Secondary | ICD-10-CM | POA: Diagnosis not present

## 2018-10-25 DIAGNOSIS — M7661 Achilles tendinitis, right leg: Secondary | ICD-10-CM | POA: Diagnosis not present

## 2018-10-25 DIAGNOSIS — M19071 Primary osteoarthritis, right ankle and foot: Secondary | ICD-10-CM | POA: Diagnosis not present

## 2018-11-01 DIAGNOSIS — L821 Other seborrheic keratosis: Secondary | ICD-10-CM | POA: Diagnosis not present

## 2018-11-01 DIAGNOSIS — D2372 Other benign neoplasm of skin of left lower limb, including hip: Secondary | ICD-10-CM | POA: Diagnosis not present

## 2018-11-25 DIAGNOSIS — D1801 Hemangioma of skin and subcutaneous tissue: Secondary | ICD-10-CM | POA: Diagnosis not present

## 2018-11-25 DIAGNOSIS — D485 Neoplasm of uncertain behavior of skin: Secondary | ICD-10-CM | POA: Diagnosis not present

## 2018-12-01 DIAGNOSIS — J3089 Other allergic rhinitis: Secondary | ICD-10-CM | POA: Diagnosis not present

## 2018-12-01 DIAGNOSIS — K219 Gastro-esophageal reflux disease without esophagitis: Secondary | ICD-10-CM | POA: Diagnosis not present

## 2018-12-16 DIAGNOSIS — F3342 Major depressive disorder, recurrent, in full remission: Secondary | ICD-10-CM | POA: Diagnosis not present

## 2019-01-14 DIAGNOSIS — Z0000 Encounter for general adult medical examination without abnormal findings: Secondary | ICD-10-CM | POA: Diagnosis not present

## 2019-01-14 DIAGNOSIS — Z13220 Encounter for screening for lipoid disorders: Secondary | ICD-10-CM | POA: Diagnosis not present

## 2019-01-14 DIAGNOSIS — Z131 Encounter for screening for diabetes mellitus: Secondary | ICD-10-CM | POA: Diagnosis not present

## 2019-01-14 DIAGNOSIS — K219 Gastro-esophageal reflux disease without esophagitis: Secondary | ICD-10-CM | POA: Diagnosis not present

## 2019-01-14 DIAGNOSIS — F419 Anxiety disorder, unspecified: Secondary | ICD-10-CM | POA: Diagnosis not present

## 2019-02-03 ENCOUNTER — Encounter (HOSPITAL_COMMUNITY): Payer: Self-pay

## 2019-02-03 ENCOUNTER — Emergency Department (HOSPITAL_COMMUNITY)
Admission: EM | Admit: 2019-02-03 | Discharge: 2019-02-03 | Disposition: A | Discharge status: Other Acute Inpatient Hospital | Payer: BLUE CROSS/BLUE SHIELD | Source: Home / Self Care | Attending: Emergency Medicine | Admitting: Emergency Medicine

## 2019-02-03 ENCOUNTER — Other Ambulatory Visit: Payer: Self-pay

## 2019-02-03 ENCOUNTER — Inpatient Hospital Stay (HOSPITAL_COMMUNITY)
Admission: AD | Admit: 2019-02-03 | Discharge: 2019-02-05 | DRG: 885 | Disposition: A | Discharge status: Home / Self Care | Payer: BLUE CROSS/BLUE SHIELD | Source: Intra-hospital | Attending: Psychiatry | Admitting: Psychiatry

## 2019-02-03 DIAGNOSIS — Z79899 Other long term (current) drug therapy: Secondary | ICD-10-CM | POA: Diagnosis not present

## 2019-02-03 DIAGNOSIS — F411 Generalized anxiety disorder: Secondary | ICD-10-CM | POA: Diagnosis present

## 2019-02-03 DIAGNOSIS — F322 Major depressive disorder, single episode, severe without psychotic features: Principal | ICD-10-CM | POA: Diagnosis present

## 2019-02-03 DIAGNOSIS — Z8249 Family history of ischemic heart disease and other diseases of the circulatory system: Secondary | ICD-10-CM

## 2019-02-03 DIAGNOSIS — Y92009 Unspecified place in unspecified non-institutional (private) residence as the place of occurrence of the external cause: Secondary | ICD-10-CM

## 2019-02-03 DIAGNOSIS — Y9389 Activity, other specified: Secondary | ICD-10-CM | POA: Insufficient documentation

## 2019-02-03 DIAGNOSIS — G4730 Sleep apnea, unspecified: Secondary | ICD-10-CM | POA: Diagnosis present

## 2019-02-03 DIAGNOSIS — R45851 Suicidal ideations: Secondary | ICD-10-CM | POA: Diagnosis not present

## 2019-02-03 DIAGNOSIS — F329 Major depressive disorder, single episode, unspecified: Secondary | ICD-10-CM

## 2019-02-03 DIAGNOSIS — T1491XA Suicide attempt, initial encounter: Secondary | ICD-10-CM

## 2019-02-03 DIAGNOSIS — X838XXA Intentional self-harm by other specified means, initial encounter: Secondary | ICD-10-CM

## 2019-02-03 DIAGNOSIS — Z818 Family history of other mental and behavioral disorders: Secondary | ICD-10-CM | POA: Diagnosis not present

## 2019-02-03 DIAGNOSIS — T502X2A Poisoning by carbonic-anhydrase inhibitors, benzothiadiazides and other diuretics, intentional self-harm, initial encounter: Secondary | ICD-10-CM

## 2019-02-03 DIAGNOSIS — F419 Anxiety disorder, unspecified: Secondary | ICD-10-CM | POA: Diagnosis not present

## 2019-02-03 DIAGNOSIS — Y999 Unspecified external cause status: Secondary | ICD-10-CM | POA: Insufficient documentation

## 2019-02-03 DIAGNOSIS — T50902A Poisoning by unspecified drugs, medicaments and biological substances, intentional self-harm, initial encounter: Secondary | ICD-10-CM | POA: Diagnosis not present

## 2019-02-03 DIAGNOSIS — G4700 Insomnia, unspecified: Secondary | ICD-10-CM | POA: Diagnosis present

## 2019-02-03 DIAGNOSIS — Z791 Long term (current) use of non-steroidal anti-inflammatories (NSAID): Secondary | ICD-10-CM

## 2019-02-03 DIAGNOSIS — Z884 Allergy status to anesthetic agent status: Secondary | ICD-10-CM | POA: Diagnosis not present

## 2019-02-03 LAB — RAPID URINE DRUG SCREEN, HOSP PERFORMED
AMPHETAMINES: NOT DETECTED
BENZODIAZEPINES: POSITIVE — AB
Barbiturates: NOT DETECTED
Cocaine: NOT DETECTED
Opiates: NOT DETECTED
TETRAHYDROCANNABINOL: NOT DETECTED

## 2019-02-03 LAB — COMPREHENSIVE METABOLIC PANEL
ALBUMIN: 4.1 g/dL (ref 3.5–5.0)
ALK PHOS: 59 U/L (ref 38–126)
ALT: 18 U/L (ref 0–44)
ANION GAP: 8 (ref 5–15)
AST: 21 U/L (ref 15–41)
BILIRUBIN TOTAL: 0.4 mg/dL (ref 0.3–1.2)
BUN: 14 mg/dL (ref 6–20)
CALCIUM: 9.1 mg/dL (ref 8.9–10.3)
CO2: 24 mmol/L (ref 22–32)
Chloride: 107 mmol/L (ref 98–111)
Creatinine, Ser: 0.84 mg/dL (ref 0.44–1.00)
GFR calc Af Amer: >60 mL/min (ref >60)
GFR calc non Af Amer: >60 mL/min (ref >60)
Glucose, Bld: 116 mg/dL — ABNORMAL HIGH (ref 70–99)
POTASSIUM: 4.2 mmol/L (ref 3.5–5.1)
SODIUM: 139 mmol/L (ref 135–145)
TOTAL PROTEIN: 7.1 g/dL (ref 6.5–8.1)

## 2019-02-03 LAB — CBC
HCT: 46.3 % — ABNORMAL HIGH (ref 36.0–46.0)
Hemoglobin: 14.5 g/dL (ref 12.0–15.0)
MCH: 26.7 pg (ref 26.0–34.0)
MCHC: 31.3 g/dL (ref 30.0–36.0)
MCV: 85.3 fL (ref 80.0–100.0)
NRBC: 0 % (ref 0.0–0.2)
PLATELETS: 232 10*3/uL (ref 150–400)
RBC: 5.43 MIL/uL — ABNORMAL HIGH (ref 3.87–5.11)
RDW: 13.7 % (ref 11.5–15.5)
WBC: 6.1 10*3/uL (ref 4.0–10.5)

## 2019-02-03 LAB — ACETAMINOPHEN LEVEL: Acetaminophen (Tylenol), Serum: <10 ug/mL — ABNORMAL LOW (ref 10–30)

## 2019-02-03 LAB — ETHANOL: Alcohol, Ethyl (B): <10 mg/dL (ref <10)

## 2019-02-03 MED ORDER — DULOXETINE HCL 60 MG PO CPEP
60.0000 mg | ORAL_CAPSULE | Freq: Every day | ORAL | Status: DC
  Filled 2019-02-03: qty 1

## 2019-02-03 MED ORDER — DULOXETINE HCL 60 MG PO CPEP
60.0000 mg | ORAL_CAPSULE | Freq: Two times a day (BID) | ORAL | Status: DC
  Administered 2019-02-03 – 2019-02-05 (×4): 60 mg via ORAL
  Filled 2019-02-03 (×11): qty 1

## 2019-02-03 MED ORDER — MAGNESIUM HYDROXIDE 400 MG/5ML PO SUSP: 30.0000 mL | Freq: Every day | ORAL | Status: DC | PRN

## 2019-02-03 MED ORDER — ALUM & MAG HYDROXIDE-SIMETH 200-200-20 MG/5ML PO SUSP: 30.0000 mL | ORAL | Status: DC | PRN

## 2019-02-03 MED ORDER — HYDROXYZINE HCL 50 MG PO TABS
50.0000 mg | ORAL_TABLET | Freq: Once | ORAL | Status: AC
  Administered 2019-02-03: 50 mg via ORAL
  Filled 2019-02-03 (×2): qty 1

## 2019-02-03 MED ORDER — OLANZAPINE 5 MG PO TABS: 5.0000 mg | ORAL_TABLET | Freq: Once | ORAL | Status: DC | PRN

## 2019-02-03 MED ORDER — ALPRAZOLAM 0.5 MG PO TABS
0.5000 mg | ORAL_TABLET | Freq: Two times a day (BID) | ORAL | Status: DC | PRN
  Administered 2019-02-03 – 2019-02-05 (×4): 0.5 mg via ORAL
  Filled 2019-02-03 (×4): qty 1

## 2019-02-03 MED ORDER — HYDROCHLOROTHIAZIDE 25 MG PO TABS
25.0000 mg | ORAL_TABLET | Freq: Every day | ORAL | Status: DC
  Filled 2019-02-03: qty 1

## 2019-02-03 MED ORDER — TRAZODONE HCL 50 MG PO TABS: 50.0000 mg | ORAL_TABLET | Freq: Every evening | ORAL | Status: DC | PRN

## 2019-02-03 MED ORDER — DULOXETINE HCL 30 MG PO CPEP
30.0000 mg | ORAL_CAPSULE | Freq: Every day | ORAL | Status: DC
  Administered 2019-02-03: 30 mg via ORAL
  Filled 2019-02-03: qty 1

## 2019-02-03 MED ORDER — DULOXETINE HCL 30 MG PO CPEP
60.0000 mg | ORAL_CAPSULE | Freq: Every day | ORAL | Status: DC
  Administered 2019-02-03: 60 mg via ORAL
  Filled 2019-02-03: qty 2

## 2019-02-03 MED ORDER — DULOXETINE HCL 30 MG PO CPEP
30.0000 mg | ORAL_CAPSULE | Freq: Every day | ORAL | Status: DC
  Filled 2019-02-03: qty 1

## 2019-02-03 MED ORDER — HYDROCHLOROTHIAZIDE 25 MG PO TABS
25.0000 mg | ORAL_TABLET | Freq: Every day | ORAL | Status: DC
  Administered 2019-02-03: 25 mg via ORAL
  Filled 2019-02-03: qty 1

## 2019-02-03 MED ORDER — TRAZODONE HCL 50 MG PO TABS
50.0000 mg | ORAL_TABLET | Freq: Every evening | ORAL | Status: DC | PRN
  Administered 2019-02-03 – 2019-02-04 (×2): 50 mg via ORAL
  Filled 2019-02-03 (×2): qty 1

## 2019-02-03 MED ORDER — HYDROXYZINE HCL 25 MG PO TABS: 25.0000 mg | ORAL_TABLET | Freq: Three times a day (TID) | ORAL | Status: DC | PRN

## 2019-02-03 MED ORDER — ACETAMINOPHEN 325 MG PO TABS: 650.0000 mg | ORAL_TABLET | Freq: Four times a day (QID) | ORAL | Status: DC | PRN

## 2019-02-03 NOTE — ED Notes (Signed)
Pt in room with tele psych.

## 2019-02-03 NOTE — ED Provider Notes (Signed)
[place] DEPT Provider Note   CSN: [id number] Arrival date & time: 02/03/19  1130    History   Chief Complaint Chief Complaint  Patient presents with  . Psychiatric Evaluation    HPI Angelica Morgan is a 53 y.o. female.     HPI Patient presents after a suicide attempt.  Last night patient took 3 of her Xanax and one half of the oxycodone.  Patient states she took it because she is been having marital problems with her husband.  States he had told her that when he came back either she needed to be gone or he would be gone.  States she took the pill in an attempt to not wake up.  She will rarely drink alcohol.  States she is on Cymbalta and had previously been pretty well controlled.  States she saw Bella Kennedy NP previously who tried to adjust medicines and it did not work.  Sees Dr. Lorine Bears now from psychiatry. Past Medical History:  Diagnosis Date  . Anxiety   . Depression   . Edema    legs and feet  . PONV (postoperative nausea and vomiting)   . Seasonal allergies   . Sleep apnea   . SVD (spontaneous vaginal delivery)    x 2    There are no active problems to display for this patient.   Past Surgical History:  Procedure Laterality Date  . CESAREAN SECTION     x 1  . LAPAROSCOPIC ASSISTED VAGINAL HYSTERECTOMY N/A 04/13/2013   Procedure: LAPAROSCOPIC ASSISTED VAGINAL HYSTERECTOMY;  Surgeon: Marylynn Pearson, MD;  Location: [facility];  Service: Gynecology;  Laterality: N/A;  . NOVASURE ABLATION    . WISDOM TOOTH EXTRACTION       OB History   No obstetric history on file.      Home Medications    Prior to Admission medications   Medication Sig Start Date End Date Taking? Authorizing Provider  ALPRAZolam (XANAX XR) 0.5 MG 24 hr tablet Take 0.5 mg by mouth at bedtime as needed for anxiety or sleep.    Yes [provider]  Cranberry 450 MG CAPS Take 1 capsule by mouth 2 (two) times daily.   Yes [provider]  DULoxetine  (CYMBALTA) 30 MG capsule Take 30 mg by mouth daily.   Yes [provider]  DULoxetine (CYMBALTA) 60 MG capsule Take 60 mg by mouth daily.   Yes [provider]  fluticasone (FLONASE) 50 MCG/ACT nasal spray Place 1 spray into both nostrils 2 (two) times daily.   Yes [provider]  Nyoka Cowden Tea 200 MG CAPS Take 2 capsules by mouth 2 (two) times daily.   Yes [provider]  hydrochlorothiazide (HYDRODIURIL) 25 MG tablet Take 25 mg by mouth daily.   Yes [provider]  Multiple Vitamin (MULTIVITAMIN WITH MINERALS) TABS Take 1 tablet by mouth daily.   Yes [provider]  Multiple Vitamins-Iron (ONE-TABLET-DAILY/IRON PO) Take 1 tablet by mouth daily.   Yes [provider]  Naphazoline-Polyethyl Glycol (EYE DROPS) 0.012-0.2 % SOLN Apply 1 drop to eye daily as needed (dry eyes).   Yes [provider]  ibuprofen (ADVIL,MOTRIN) 600 MG tablet Take 1 tablet (600 mg total) by mouth every 6 (six) hours as needed. Patient not taking: Reported on 02/03/2019 04/14/13   Marylynn Pearson, MD  oxyCODONE-acetaminophen (PERCOCET/ROXICET) 5-325 MG per tablet Take 1-2 tablets by mouth every 4 (four) hours as needed. Patient not taking: Reported on 02/03/2019 04/14/13   Julien Girt,  Elzie Rings, MD    Family History No family history on file.  Social History Social History   Tobacco Use  . Smoking status: Never Smoker  . Smokeless tobacco: Never Used  Substance Use Topics  . Alcohol use: Yes    Comment: socially  . Drug use: No     Allergies   Anesthetic [benzocaine]   Review of Systems Review of Systems  Constitutional: Negative for appetite change.  HENT: Negative for congestion.   Respiratory: Negative for shortness of breath.   Gastrointestinal: Negative for abdominal distention.  Genitourinary: Negative for flank pain.  Musculoskeletal: Negative for back pain.  Neurological: Negative for seizures.  Psychiatric/Behavioral: Positive  for suicidal ideas. Negative for confusion and hallucinations.     Physical Exam Updated Vital Signs BP 121/77 (BP Location: Left Arm)   Pulse 96   Temp 97.6 F (36.4 C) (Oral)   Resp 17   Wt 96 kg   LMP 03/17/2013   SpO2 98%   BMI 35.22 kg/m   Physical Exam Vitals signs and nursing note reviewed.  HENT:     Head: Atraumatic.     Mouth/Throat:     Mouth: Mucous membranes are moist.  Neck:     Musculoskeletal: Neck supple.  Cardiovascular:     Rate and Rhythm: Regular rhythm.  Pulmonary:     Effort: Pulmonary effort is normal.  Abdominal:     Tenderness: There is no abdominal tenderness.  Musculoskeletal:     Right lower leg: No edema.     Left lower leg: No edema.  Skin:    General: Skin is warm.     Capillary Refill: Capillary refill takes less than 2 seconds.  Neurological:     Mental Status: She is alert and oriented to person, place, and time.  Psychiatric:        Mood and Affect: Mood normal.      ED Treatments / Results  Labs (all labs ordered are listed, but only abnormal results are displayed) Labs Reviewed  ACETAMINOPHEN LEVEL - Abnormal; Notable for the following components:      Result Value   Acetaminophen (Tylenol), Serum <10 (*)    All other components within normal limits  COMPREHENSIVE METABOLIC PANEL - Abnormal; Notable for the following components:   Glucose, Bld 116 (*)    All other components within normal limits  CBC - Abnormal; Notable for the following components:   RBC 5.43 (*)    HCT 46.3 (*)    All other components within normal limits  ETHANOL  RAPID URINE DRUG SCREEN, HOSP PERFORMED    EKG None  Radiology No results found.  Procedures Procedures (including critical care time)  Medications Ordered in ED Medications - No data to display   Initial Impression / Assessment and Plan / ED Course  I have reviewed the triage vital signs and the nursing notes.  Pertinent labs & imaging results that were available during  my care of the patient were reviewed by me and considered in my medical decision making (see chart for details).        Patient is here after suicide attempt yesterday.  Overdosed on Xanax and oxycodone.  Medically cleared at this time.  To be seen by TTS  Final Clinical Impressions(s) / ED Diagnoses   Final diagnoses:  Suicide attempt [facility])  Intentional drug overdose, initial encounter [facility])    ED Discharge Orders    None       Davonna Belling, MD 02/03/19 1406

## 2019-02-03 NOTE — BH Assessment (Addendum)
Tele Assessment Note   Patient Name: Angelica Morgan MRN: [id number] Referring Physician: Dr. Davonna Belling, MD Location of Patient: [facility] Location of Provider: [facility]  Angelica Morgan is an 53 y.o. female brought to [facility] after a suicide attempt.  Pt states "my husband and I had an argument on my son's 21st birthday that carried over a couple days. I took the pills because I couldn't imagine leaving my husband or him leaving me; so I thought taking the pills would make it easy for both of [place].  I was never suicidal, just wanted to prevent him from having to leave me."  [facility] asked pt if she understood the concept suicide?"  Pt states "yes, but I just wanted to make it easier for me and my husband and thought this would be the best way."  Pt admits socially drinking (3-4 glasses mix drinks or bottles of beer), last drinked 01/29/2019.  Pt denies HI/A/V-hallucination.    Pt reports that she "experienced the death of 2 family members and travelled to [facility] to help her sister take care of her brother in law with brain cancer.  Pt also had to help sister take care of niece with down syndrome.  Then when got back to [place], 2 weeks later, had son's 19st birthday party. Going through martial issues and emptiness syndrome."  Pt receives outpatient counseling and medication management with Dr. Robina Ade.  Pt does not have a history of inpatient MH treatment.  Pt reside with her husband of 83 yrs.  Pt and her husband has 3 children whom all live outside of the home.  Pt works a part time job and and part-time as a Psychologist, sport and exercise.  Pt denies a history of physical, sexual, and verbal abuse.    Patient was wearing scrubs and appeared appropriately groomed.  Pt was alert throughout the assessment.  Patient made good eye contact and had normal psychomotor activity.  Patient spoke in a normal) voice without pressured speech.  Pt expressed feeling sad.  Pt's affect appeared  dysphoric and congruent with stated mood. Pt's thought process was coherent and logical.  Pt presented with partial insight and judgement   Disposition: Case discussed with [facility] provider, Jinny Blossom, NP who recommends inpatient treatment.  AC Hollice Gong, RN reports that [facility] provider, Jinny Blossom, NP accepted pt into [facility] Adult room 401 bed 01.  The attending provider will be Dr. Neita Garnet, MD.  Diagnosis: Major Depression Disorder  Past Medical History:  Past Medical History:  Diagnosis Date  . Anxiety   . Depression   . Edema    legs and feet  . PONV (postoperative nausea and vomiting)   . Seasonal allergies   . Sleep apnea   . SVD (spontaneous vaginal delivery)    x 2    Past Surgical History:  Procedure Laterality Date  . CESAREAN SECTION     x 1  . LAPAROSCOPIC ASSISTED VAGINAL HYSTERECTOMY N/A 04/13/2013   Procedure: LAPAROSCOPIC ASSISTED VAGINAL HYSTERECTOMY;  Surgeon: Marylynn Pearson, MD;  Location: [facility];  Service: Gynecology;  Laterality: N/A;  . NOVASURE ABLATION    . WISDOM TOOTH EXTRACTION      Family History: No family history on file.  Social History:  reports that she has never smoked. She has never used smokeless tobacco. She reports current alcohol use. She reports that she does not use drugs.  Additional Social History:  Alcohol / Drug Use Pain Medications: See MARs Prescriptions:  See MARs Over the Counter: See MARs History of alcohol / drug use?: Yes Longest period of sobriety (when/how long): unknown Negative Consequences of Use: Personal relationships Substance #1 Name of Substance 1: Alcohol 1 - Age of First Use: 53 y/o 1 - Amount (size/oz): " a couple of beers or a couple of mixed drinks" 1 - Frequency: socially with friends 1 - Duration: ongoing 1 - Last Use / Amount: 01/29/2019  CIWA: CIWA-Ar BP: 121/77 Pulse Rate: 96 COWS:    Allergies:  Allergies  Allergen Reactions  . Anesthetic [Benzocaine] Nausea And Vomiting     Home Medications: (Not in a hospital admission)   OB/GYN Status:  Patient's last menstrual period was 03/17/2013.  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) Pregnancy Status: Unknown Living Arrangements: Spouse/significant other Can pt return to current living arrangement?: Yes Admission Status: Voluntary Is patient capable of signing voluntary admission?: Yes Referral Source: Self/Family/Friend     Crisis Care Plan Living Arrangements: Spouse/significant other  Education Status Is patient currently in school?: No Is the patient employed, unemployed or receiving disability?: Employed  Risk to self with the past 6 months Suicidal Ideation: Yes-Currently Present(pt denies) Has patient been a risk to self within the past 6 months prior to admission? : No(pt denies) Suicidal Intent: No-Not Currently/Within Last 6 Months(pt is denying) Has patient had any suicidal intent within the past 6 months prior to admission? : No(pt is denying) Is patient at risk for suicide?: No(pt denying) Suicidal Plan?: No Has patient had any suicidal plan within the past 6 months prior to admission? : No Access to Means: No What has been your use of drugs/alcohol within the last 12 months?: Alcohol Previous Attempts/Gestures: Yes How many times?: 1 Triggers for Past Attempts: Family contact Family Suicide History: No Recent stressful life event(s): Loss (Comment), Other (Comment) Persecutory voices/beliefs?: No(2-death experienced, family illness) Depression: Yes Depression Symptoms: Tearfulness, Feeling  worthless/self pity Substance abuse history and/or treatment for substance abuse?: No Suicide prevention information given to non-admitted patients: Not applicable  Risk to Others within the past 6 months Homicidal Ideation: No Does patient have any lifetime risk of violence toward others beyond the six months prior to admission? : No Thoughts of Harm to Others: No Current Homicidal Intent: No Current Homicidal Plan: No Access to Homicidal Means: No History of harm to others?: No Assessment of Violence: None Noted Does patient have access to weapons?: No Criminal Charges Pending?: No Does patient have a court date: No Is patient on probation?: No  Psychosis Hallucinations: None noted Delusions: None noted  Mental Status Report Appearance/Hygiene: In scrubs Eye Contact: Fair Motor Activity: Freedom of movement Speech: Logical/coherent Level of Consciousness: Alert, Quiet/awake Mood: Depressed, Sad Affect: Appropriate to circumstance, Depressed Anxiety Level: None Thought Processes: Coherent, Relevant Judgement: Partial Orientation: Person, Place, Appropriate for developmental age Obsessive Compulsive Thoughts/Behaviors: None  Cognitive Functioning Concentration: Normal Memory: Recent Intact, Remote Intact Is patient IDD: No Insight: Fair Impulse Control: Fair Appetite: Fair Have you had any weight changes? : No Change Sleep: No Change Total Hours of Sleep: 6 Vegetative Symptoms: None  ADLScreening [facility] Assessment Services) Patient's cognitive ability adequate to safely complete daily activities?: Yes  Patient able to express need for assistance with ADLs?: Yes Independently performs ADLs?: Yes (appropriate for developmental age)  Prior Inpatient Therapy Prior Inpatient Therapy: No  Prior Outpatient Therapy Prior Outpatient Therapy: Yes Prior Therapy Dates: 04/2017 Prior Therapy Facilty/Provider(s): Dr. Robina Ade Reason for Treatment: Depression Does patient have  an ACCT team?: No Does patient have Intensive In-House Services?  : No Does patient have [facility] services? : No Does patient have P4CC services?: No  ADL Screening (condition at time of admission) Patient's cognitive ability adequate to safely complete daily activities?: Yes Is the patient deaf or have difficulty hearing?: No Does the patient have difficulty seeing, even when wearing glasses/contacts?: No Does the patient have difficulty concentrating, remembering, or making decisions?: No Patient able to express need for assistance with ADLs?: Yes Does the patient have difficulty dressing or bathing?: No Independently performs ADLs?: Yes (appropriate for developmental age) Does the patient have difficulty walking or climbing stairs?: No Weakness of Legs: None Weakness of Arms/Hands: None  Home Assistive Devices/Equipment Home Assistive Devices/Equipment: None          Advance Directives (For Healthcare) Does Patient Have a Medical Advance Directive?: No Would patient like information on creating a medical advance directive?: No - Patient declined          Disposition: Case discussed with [facility] provider, Jinny Blossom, NP who recommends inpatient treatment.  AC Ivy Lynn, RN reports that [facility] provider, Jinny Blossom, NP accepted pt into [facility] Adult room 401 bed 01.  The attending provider will be Dr. Neita Garnet, MD.    This service was provided via telemedicine using a 2-way, interactive audio and video technology.  Names of all persons participating in this telemedicine service and their role in this encounter. Name: Presli Fanguy Role: patient  Name: Lillia Pauls Role: Husband  Name: Jinny Blossom, NP Role: [facility] provider  Name: Gennette Shadix L. Kenny Rea, MS, [facility] Role: Triage Therapist    Sylvester Harder, [place], [facility] 02/03/2019 2:25 PM

## 2019-02-03 NOTE — Progress Notes (Signed)
Pt A & O X4. Presents tearful with depressed affect and mood. Ambulatory to TCU with a steady gait. Reports incident of overdosing on Xenax (0.25 mg X3 tabs and Oxycodone) unknown amount with the intention of "killing myself, because my husband told me that he either leave or I leave because I had som marital issues in the past, my daughter told him some things in regards to that; that was not true to that made him angry". Denies SI, HI,AVH and pain at this time. Reports history of depression and anxiety for "over 20 years, I take Cymbalta and Xenax". Pt rates her depression 9/10 and anxiety 8/10 "I'm anxious because I don't know what my family and neighbors will think of me, I'm suppose to have a event at my house like next week". Denies substance abuse or in patient admission. Pt encouraged to voice concerns. Safety checks maintained at Q 15 minutes intervals, assigned staff in attendance at all times.

## 2019-02-03 NOTE — BH Assessment (Signed)
[facility] Assessment Progress Note  Per Jinny Blossom, FNP, this pt requires psychiatric hospitalization at this time.  Leonia Reader, RN, [facility] has assigned pt to [facility] Rm 401-1.  Pt has reluctantly signed Voluntary Admission and Consent for Treatment, as well as Consent to Release Information to pt's psychiatrist, Chucky May, MD, and to an unspecified therapist named Dorian Pod.  A notification call has been placed to Dr Toy Care, and signed forms have been faxed to [facility].  Pt's nurse, Nicoletta Dress, has been notified, and agrees to send original paperwork along with pt via Betsy Pries, and to call report to [phone].  Jalene Mullet, [place] Coordinator [phone]

## 2019-02-03 NOTE — ED Triage Notes (Signed)
Pt arrives for SI attempt yesterday- attempted to OD with xanax (3) 0.25, and 1 oxycodone. Pt's husband wanted pt to come for eval.

## 2019-02-03 NOTE — BHH Suicide Risk Assessment (Signed)
[facility] Admission Suicide Risk Assessment   Nursing information obtained from:   Patient and chart Demographic factors:   53 year old married female Current Mental Status:   See below Loss Factors:   Marital tension/discord Historical Factors:   History of depression, anxiety Risk Reduction Factors:   Resilience  Total Time spent with patient: 45 minutes Principal Problem: MDD, S/P Suicide Attempt by Overdose Diagnosis:  MDD Subjective Data:   Continued Clinical Symptoms:    The "Alcohol Use Disorders Identification Test", Guidelines for Use in Primary Care, Second Edition.  World Pharmacologist [facility]). Score between 0-7:  no or low risk or alcohol related problems. Score between 8-15:  moderate risk of alcohol related problems. Score between 16-19:  high risk of alcohol related problems. Score 20 or above:  warrants further diagnostic evaluation for alcohol dependence and treatment.   CLINICAL FACTORS:  53 year old married female, reports recent suicide attempt by overdosing on oxycodone (which was prescribed to her husband), zolpidem(which she is prescribed).  States she took only 2 tablets of oxycodone and 1 tablet of zolpidem.  Does acknowledge overdose was with suicidal intent, and that she wrote a suicide note to her husband.  States she was acutely depressed and upset due to exacerbated marital discord and him making statement about separating .  She acknowledges a history of depression and anxiety for which she has been on Cymbalta and Xanax for years.  States she has had some depressive symptoms recently, but denies major neurovegetative symptoms or any suicidal ideations leading up to day of admission.    Psychiatric Specialty Exam: Physical Exam  ROS  Blood pressure 121/77, pulse 96, temperature 97.6 F (36.4 C), temperature source Oral, resp. rate 17, weight 96 kg, last menstrual period 03/17/2013, SpO2 98 %.Body mass index is 35.22 kg/m.  See admit note  MSE   COGNITIVE FEATURES THAT CONTRIBUTE TO RISK:  Closed-mindedness and Loss of executive function    SUICIDE RISK:   Moderate:  Frequent suicidal ideation with limited intensity, and duration, some specificity in terms of plans, no associated intent, good self-control, limited dysphoria/symptomatology, some risk factors present, and identifiable protective factors, including available and accessible social support.  PLAN OF CARE: Patient will be admitted to inpatient psychiatric unit for stabilization and safety. Will provide and encourage milieu participation. Provide medication management and maked adjustments as needed.  Will follow daily.    I certify that inpatient services furnished can reasonably be expected to improve the patient's condition.   Jenne Campus, MD 02/03/2019, 5:54 PM

## 2019-02-03 NOTE — Progress Notes (Signed)
Patient ID: Angelica Morgan, female   DOB: November 10, 1966, 53 y.o.   MRN: [id number]  Patient presented to [facility] voluntarily due to increased stress, depression, and anxiety. Patient has been married to her husband for 30+ years and they are current;y having marital issues. Patient said most of this is due to her daughters causing "drama." She currently lives with her husband, and after a stressful night of having people over, her husband came upstairs and said "it's either me who goes or you." Patient said that is when she decided to intentionally overdose. She said "I knew I couldn't live without him, so I decided to take myself out." Patient is not suicidal at the moment. Denies SI HI AVH.  Skin search was performed with Taffney, MHT and was found unremarkable.  Safety is maintained with 15 minute checks as well as environmental checks. Will continue to monitor and provide support. Patient is a DNA due to nightmares and being unable to tolerate a roommate at this time.

## 2019-02-03 NOTE — Tx Team (Signed)
Initial Treatment Plan 02/03/2019 6:59 PM Angelica Morgan YTK:[id number]    PATIENT STRESSORS: Marital or family conflict   PATIENT STRENGTHS: Ability for insight Active sense of humor Average or above average intelligence Capable of independent living   PATIENT IDENTIFIED PROBLEMS: "insecurities"  depression  anxiety                 DISCHARGE CRITERIA:  Ability to meet basic life and health needs Adequate post-discharge living arrangements Improved stabilization in mood, thinking, and/or behavior  PRELIMINARY DISCHARGE PLAN: Outpatient therapy  PATIENT/FAMILY INVOLVEMENT: This treatment plan has been presented to and reviewed with the patient, [name]. The patient and family have been given the opportunity to ask questions and make suggestions.  Megan Mans, RN 02/03/2019, 6:59 PM

## 2019-02-03 NOTE — H&P (Signed)
Psychiatric Admission Assessment Adult  Patient Identification: Angelica Morgan MRN:  [id number] Date of Evaluation:  02/03/2019 Chief Complaint:  " I scared my family " Principal Diagnosis: S/P Suicide Attempt / MDD  Diagnosis:  Active Problems:   MDD (major depressive disorder), single episode, severe , no psychosis ([place])  History of Present Illness: 53 year old female. Presented to hospital via EMS. Reports history of marital tension , recently exacerbated . States there she had prior concerns about infidelity during his work trips, but states this issue had been worked on and was no longer bothering her . States one of her daughters , who was visiting, brought it up again recently, leading to husband becoming angry. . She also reports other recent stressors, and had recently returned from a trip to [place] where she attended funeral of a family member and recently found out one of her brothers in law has brain cancer .She reports husband yesterday told her that " either I go or he goes". After this patient felt acutely upset , depressed, and overdosed on Oxycodone ( from husband's prior prescription) and Zolpidem.States " I only took two Oxycodones and one Zolpidem". States her intention at the time was " to die". States " I felt it was just easier for all of [place] if I just died ". She did write a suicide note.   States that she has had some depression , but was not having any suicidal ideations prior to above , and also minimizes neuro-vegetative symptoms of depression, states sleep, appetite, energy have been normal and does not endorse anhedonia. Also denies any psychotic symptoms. Associated Signs/Symptoms: Depression Symptoms:  suicidal attempt, describes decreased attention recently (Hypo) Manic Symptoms: none noted or endorsed  Anxiety Symptoms: reports some increased anxiety related to marital concerns Psychotic Symptoms:  Denies  PTSD Symptoms: Denies  Total Time spent with patient: 45  minutes  Past Psychiatric History: no prior psychiatric admissions , no history of past suicide attempts, no history of self cutting, no history of psychosis, no history of mania or hypomania. Reports history of panic attacks, does not endorse agoraphobia. No clear history of Social Phobia.  Does not endorse history of PTSD.   Is the patient at risk to self? Yes.    Has the patient been a risk to self in the past 6 months? No.  Has the patient been a risk to self within the distant past? No.  Is the patient a risk to others? No.  Has the patient been a risk to others in the past 6 months? No.  Has the patient been a risk to others within the distant past? No.   Prior Inpatient Therapy:  denies  Prior Outpatient Therapy:  Dr. Toy Care for psychiatric management. She has a therapist, but has not been seeing her as regularly recently.  Alcohol Screening:   Substance Abuse History in the last 12 months:  States she drinks one to two drinks on weekends, denies history of drug abuse, is prescribed Xanax, denies abusing or misusing , states she has never run out early .  Consequences of Substance Abuse: Denies  Previous Psychotropic Medications: Cymbalta 90 mgrs QDAY, has been on it for several years, Xanax 0.5 mgrs QDAY for anxiety. Has been on it for several years . Remembers she was on Latuda, Trintellix, Zoloft in the past, and does not feel these worked well for her.   Psychological Evaluations: No  Past Medical History: Denies medical illnesses .NKDA. Takes HCTZ for peripheral edema  at times . States takes only occasionally. Past Medical History:  Diagnosis Date  . Anxiety   . Depression   . Edema    legs and feet  . PONV (postoperative nausea and vomiting)   . Seasonal allergies   . Sleep apnea   . SVD (spontaneous vaginal delivery)    x 2    Past Surgical History:  Procedure Laterality Date  . CESAREAN SECTION     x 1  . LAPAROSCOPIC ASSISTED VAGINAL HYSTERECTOMY N/A 04/13/2013    Procedure: LAPAROSCOPIC ASSISTED VAGINAL HYSTERECTOMY;  Surgeon: Marylynn Pearson, MD;  Location: [facility];  Service: Gynecology;  Laterality: N/A;  . NOVASURE ABLATION    . WISDOM TOOTH EXTRACTION     Family History: parents deceased, mother died from CVA, father from CHF. Has 12 siblings Family Psychiatric  History: maternal grandfather committed suicide. No history of substance abuse or alcohol abuse in family. States a sister has history of depression.  Tobacco Screening:  does not smoke or vape  Social History: married, has three adult children, lives with husband and son. Volunteers at [employer].  Social History   Substance and Sexual Activity  Alcohol Use Yes   Comment: socially     Social History   Substance and Sexual Activity  Drug Use No    Additional Social History:  Allergies:   Allergies  Allergen Reactions  . Anesthetic [Benzocaine] Nausea And Vomiting   Lab Results:  Results for orders placed or performed during the hospital encounter of 02/03/19 (from the past 48 hour(s))  Acetaminophen level     Status: Abnormal   Collection Time: 02/03/19 12:56 PM  Result Value Ref Range   Acetaminophen (Tylenol), Serum <10 (L) 10 - 30 ug/mL    Comment: (NOTE) Therapeutic concentrations vary significantly. A range of 10-30 ug/mL  may be an effective concentration for many patients. However, some  are best treated at concentrations outside of this range. Acetaminophen concentrations >150 ug/mL at 4 hours after ingestion  and >50 ug/mL at 12 hours after ingestion are often associated with  toxic reactions. Performed at [facility], [place] [address], [place], [place] [address]   Ethanol     Status: None   Collection Time: 02/03/19 12:56 PM  Result Value Ref Range   Alcohol, Ethyl (B) <10 <10 mg/dL    Comment: (NOTE) Lowest detectable limit for serum alcohol is 10 mg/dL. For medical purposes only. Performed at [facility], [place] [address], [place], [place] [address]   Comprehensive metabolic panel     Status: Abnormal   Collection Time: 02/03/19 12:57 PM  Result Value Ref Range   Sodium 139 135 - 145 mmol/L   Potassium 4.2 3.5 - 5.1 mmol/L   Chloride 107 98 - 111 mmol/L   CO2 24 22 - 32 mmol/L   Glucose, Bld 116 (H) 70 - 99 mg/dL   BUN 14 6 - 20 mg/dL   Creatinine, Ser 0.84 0.44 - 1.00 mg/dL   Calcium 9.1 8.9 - 10.3 mg/dL   Total Protein 7.1 6.5 - 8.1 g/dL   Albumin 4.1 3.5 - 5.0 g/dL   AST 21 15 - 41 U/L   ALT 18 0 - 44 U/L   Alkaline Phosphatase 59 38 - 126 U/L   Total Bilirubin 0.4 0.3 - 1.2 mg/dL   GFR calc non Af Amer >60 >60 mL/min   GFR calc Af Amer >60 >60 mL/min   Anion gap 8 5 - 15  Comment: Performed at [facility], [place] [address], [place], [place] [address]  CBC     Status: Abnormal   Collection Time: 02/03/19 12:57 PM  Result Value Ref Range   WBC 6.1 4.0 - 10.5 K/uL   RBC 5.43 (H) 3.87 - 5.11 MIL/uL   Hemoglobin 14.5 12.0 - 15.0 g/dL   HCT 46.3 (H) 36.0 - 46.0 %   MCV 85.3 80.0 - 100.0 fL   MCH 26.7 26.0 - 34.0 pg   MCHC 31.3 30.0 - 36.0 g/dL   RDW 13.7 11.5 - 15.5 %   Platelets 232 150 - 400 K/uL   nRBC 0.0 0.0 - 0.2 %    Comment: Performed at [facility], [place] [address] [place], [place] [address]  Urine rapid drug screen (hosp performed)     Status: Abnormal   Collection Time: 02/03/19  2:33 PM  Result Value Ref Range   Opiates NONE DETECTED NONE DETECTED   Cocaine NONE DETECTED NONE DETECTED   Benzodiazepines POSITIVE (A) NONE DETECTED   Amphetamines NONE DETECTED NONE DETECTED   Tetrahydrocannabinol NONE DETECTED NONE DETECTED   Barbiturates NONE DETECTED NONE DETECTED    Comment: (NOTE) DRUG SCREEN FOR MEDICAL PURPOSES ONLY.  IF CONFIRMATION IS NEEDED FOR ANY PURPOSE, NOTIFY LAB WITHIN 5 DAYS. LOWEST DETECTABLE LIMITS FOR URINE DRUG SCREEN Drug Class                     Cutoff (ng/mL) Amphetamine and metabolites     1000 Barbiturate and metabolites    200 Benzodiazepine                 263 Tricyclics and metabolites     300 Opiates and metabolites        300 Cocaine and metabolites        300 THC                            50 Performed at [facility], [place] [address], [place], [place] [address]     Blood Alcohol level:  Lab Results  Component Value Date   ETH <10 62/56/3893    Metabolic Disorder Labs:  No results found for: HGBA1C, MPG No results found for: PROLACTIN No results found for: CHOL, TRIG, HDL, CHOLHDL, VLDL, LDLCALC  Current Medications: Current Facility-Administered Medications  Medication Dose Route Frequency Provider Last Rate Last Dose  . acetaminophen (TYLENOL) tablet 650 mg  650 mg Oral Q6H PRN Ethelene Hal, NP      . alum & mag hydroxide-simeth (MAALOX/MYLANTA) 200-200-20 MG/5ML suspension 30 mL  30 mL Oral Q4H PRN Ethelene Hal, NP      . Derrill Memo ON 02/04/2019] DULoxetine (CYMBALTA) DR capsule 30 mg  30 mg Oral Daily Ethelene Hal, NP      . Derrill Memo ON 02/04/2019] DULoxetine (CYMBALTA) DR capsule 60 mg  60 mg Oral Daily Ethelene Hal, NP      . Derrill Memo ON 02/04/2019] hydrochlorothiazide (HYDRODIURIL) tablet 25 mg  25 mg Oral Daily Ethelene Hal, NP      . hydrOXYzine (ATARAX/VISTARIL) tablet 25 mg  25 mg Oral TID PRN Ethelene Hal, NP      . magnesium hydroxide (MILK OF MAGNESIA) suspension 30 mL  30 mL Oral Daily PRN Ethelene Hal, NP      . OLANZapine [facility]) tablet 5 mg  5 mg Oral Once PRN  Ethelene Hal, NP      . traZODone (DESYREL) tablet 50 mg  50 mg Oral QHS PRN Ethelene Hal, NP       PTA Medications: Medications Prior to Admission  Medication Sig Dispense Refill Last Dose  . ALPRAZolam (XANAX XR) 0.5 MG 24 hr tablet Take 0.5 mg by mouth at bedtime as needed for anxiety or sleep.    02/02/2019 at Unknown time  . Cranberry 450 MG CAPS Take 1 capsule by mouth 2 (two) times  daily.   02/02/2019 at Unknown time  . DULoxetine (CYMBALTA) 30 MG capsule Take 30 mg by mouth daily.   02/02/2019 at Unknown time  . DULoxetine (CYMBALTA) 60 MG capsule Take 60 mg by mouth daily.   02/02/2019 at Unknown time  . fluticasone (FLONASE) 50 MCG/ACT nasal spray Place 1 spray into both nostrils 2 (two) times daily.   02/02/2019 at Unknown time  . Green Tea 200 MG CAPS Take 2 capsules by mouth 2 (two) times daily.   02/02/2019 at Unknown time  . hydrochlorothiazide (HYDRODIURIL) 25 MG tablet Take 25 mg by mouth daily.   02/02/2019 at Unknown time  . ibuprofen (ADVIL,MOTRIN) 600 MG tablet Take 1 tablet (600 mg total) by mouth every 6 (six) hours as needed. (Patient not taking: Reported on 02/03/2019) 30 tablet 0 Completed Course at Unknown time  . Multiple Vitamin (MULTIVITAMIN WITH MINERALS) TABS Take 1 tablet by mouth daily.   02/02/2019 at Unknown time  . Multiple Vitamins-Iron (ONE-TABLET-DAILY/IRON PO) Take 1 tablet by mouth daily.   02/02/2019 at Unknown time  . Naphazoline-Polyethyl Glycol (EYE DROPS) 0.012-0.2 % SOLN Apply 1 drop to eye daily as needed (dry eyes).   unknown  . oxyCODONE-acetaminophen (PERCOCET/ROXICET) 5-325 MG per tablet Take 1-2 tablets by mouth every 4 (four) hours as needed. (Patient not taking: Reported on 02/03/2019) 30 tablet 0 Completed Course at Unknown time    Musculoskeletal: Strength & Muscle Tone: within normal limits Gait & Station: normal Patient leans: N/A  Psychiatric Specialty Exam: Physical Exam  Review of Systems  Constitutional: Negative for chills and fever.  HENT: Negative.   Eyes: Negative.   Respiratory: Negative.   Cardiovascular: Negative.   Gastrointestinal: Negative for diarrhea, nausea and vomiting.  Genitourinary: Negative.   Musculoskeletal: Negative.   Skin: Negative.   Neurological: Negative for seizures and headaches.  Endo/Heme/Allergies: Negative.   Psychiatric/Behavioral: Positive for depression and suicidal ideas.  All  other systems reviewed and are negative.   Last menstrual period 03/17/2013.There is no height or weight on file to calculate BMI.  General Appearance: Well Groomed  Eye Contact:  Good  Speech:  Normal Rate  Volume:  Normal  Mood:  depressed, but states " feeling better"  Affect:  Appropriate and mildly anxious. Smiles at times appropriately   Thought Process:  Linear and Descriptions of Associations: Intact  Orientation:  Full (Time, Place, and Person)  Thought Content:  no hallucinations, no delusions, not internally preoccupied   Suicidal Thoughts:  No denies any suicidal ideations or self injurious ideations at this time, denies any homicidal or violent ideations and specifically also denies homicidal ideations towards husband or any other family  Homicidal Thoughts:  No  Memory:  recent and remote grossly intact   Judgement:  Fair  Insight:  Fair  Psychomotor Activity:  Normal  Concentration:  Concentration: Good and Attention Span: Good  Recall:  Good  Fund of Knowledge:  Good  Language:  Good  Akathisia:  Negative  Handed:  Right  AIMS (if indicated):     Assets:  Desire for Improvement Resilience  ADL's:  Intact  Cognition:  WNL  Sleep:       Treatment Plan Summary: Daily contact with patient to assess and evaluate symptoms and progress in treatment, Medication management, Plan inpatient treatment  and medications as below  Observation Level/Precautions:  15 minute checks  Laboratory:  TSH  Psychotherapy:  [place], group therapy   Medications:  We discussed options- patient states Cymbalta has been effective and well tolerated ,  feels it has worked better than other medication trials, so wants to continue. She is interested in increasing dose further to 120 mgrs daily .  We discussed precautions regarding long term management with Xanax/BZDs including risk of dependence, abuse, cognitive side effects. Patient reports she takes 0.5 mgrs QDAY for several years now, denies  any abuse , misuse  and is currently reluctant to initiate taper Xanax 0.5 mgrs BID PRN for anxiety    Consultations:  As needed   Discharge Concerns:  -  Estimated LOS:3-4 days   Other:     Physician Treatment Plan for Primary Diagnosis:  S/ P Suicide Attempt Long Term Goal(s): Improvement in symptoms so as ready for discharge  Short Term Goals: Ability to identify changes in lifestyle to reduce recurrence of condition will improve, Ability to verbalize feelings will improve, Ability to disclose and discuss suicidal ideas, Ability to demonstrate self-control will improve, Ability to identify and develop effective coping behaviors will improve and Ability to maintain clinical measurements within normal limits will improve  Physician Treatment Plan for Secondary Diagnosis: Active Problems:   MDD (major depressive disorder), single episode, severe , no psychosis ([place])  Long Term Goal(s): Improvement in symptoms so as ready for discharge  Short Term Goals: Ability to identify changes in lifestyle to reduce recurrence of condition will improve, Ability to verbalize feelings will improve, Ability to disclose and discuss suicidal ideas, Ability to demonstrate self-control will improve, Ability to identify and develop effective coping behaviors will improve and Ability to maintain clinical measurements within normal limits will improve  I certify that inpatient services furnished can reasonably be expected to improve the patient's condition.    Jenne Campus, MD 3/19/20204:57 PM

## 2019-02-03 NOTE — Progress Notes (Signed)
D: Pt was in her room upon initial approach.  She presents with anxious, depressed affect and mood.  Reports her day was "not too bad."  Her goal is "to get out of here."  Pt reports the best part of her day was talking to the day shift nurse, Alyssa.  Pt denies SI/HI, hallucinations, and pain.  She has been visible in milieu intermittently.    A: Introduced self to pt.  Met with pt 1:1.  Actively listened to pt and provided support and encouragement.  PRN medication administered for sleep.  Pt continued to report difficulty sleeping after medication administered and on-site provider was notified.  Vistaril 50 mg POX1 and PRN medication for sleep was ordered and administered.  Fall prevention techniques reviewed with pt and she verbalized understanding.  Q15 minute safety checks maintained.  R: Pt is compliant with medications.  She verbally contracts for safety and reports she will inform staff of needs and concerns.  Will continue to monitor and assess.

## 2019-02-04 LAB — LIPID PANEL
Cholesterol: 269 mg/dL — ABNORMAL HIGH (ref 0–200)
HDL: 70 mg/dL (ref >40)
LDL Cholesterol: 172 mg/dL — ABNORMAL HIGH (ref 0–99)
Total CHOL/HDL Ratio: 3.8 RATIO
Triglycerides: 133 mg/dL (ref <150)
VLDL: 27 mg/dL (ref 0–40)

## 2019-02-04 LAB — TSH: TSH: 2.381 u[IU]/mL (ref 0.350–4.500)

## 2019-02-04 LAB — HEMOGLOBIN A1C
Hgb A1c MFr Bld: 5.8 % — ABNORMAL HIGH (ref 4.8–5.6)
Mean Plasma Glucose: 119.76 mg/dL

## 2019-02-04 NOTE — Progress Notes (Addendum)
[name] MD Progress Note  02/04/2019 1:45 PM Angelica Morgan  MRN:  [id number] Subjective:  "I slept well with the medicine last night."  Ms. Hourihan found socializing in the dayroom. She reports improved sleep with trazodone last night. Cymbalta was increased yesterday. Denies medication side effects. States that recent overdose was impulsive. She had recently returned from caring for a sick family member in [place] and gotten into an argument with both her daughters and husband. She overdosed impulsively due to "feeling like they were all against me." Denies any recent history of SI. Her husband visited last night and apologized. With patient's consent, collateral information was obtained over the phone from patient's husband. He reports patient has seemed more irritable recently, possibly related to perimenopause, but he states patient seems to have returned to her baseline mood. She has been going to some groups. She states she would like to return to therapy after discharge, as well as following up with her outpatient psychiatrist.   From admission H&P: 53 year old female. Presented to hospital via EMS. Reports history of marital tension , recently exacerbated . States there she had prior concerns about infidelity during his work trips, but states this issue had been worked on and was no longer bothering her . States one of her daughters , who was visiting, brought it up again recently, leading to husband becoming angry. . She also reports other recent stressors, and had recently returned from a trip to [place] where she attended funeral of a family member and recently found out one of her brothers in law has brain cancer .She reports husband yesterday told her that " either I go or he goes". After this patient felt acutely upset , depressed, and overdosed on Oxycodone ( from husband's prior prescription) and Zolpidem.States " I only took two Oxycodones and one Zolpidem". States her intention at the time was " to  die". States " I felt it was just easier for all of [place] if I just died ". She did write a suicide note.    Principal Problem: MDD (major depressive disorder), single episode, severe , no psychosis ([place]) Diagnosis: Principal Problem:   MDD (major depressive disorder), single episode, severe , no psychosis ([place])  Total Time spent with patient: 15 minutes  Past Psychiatric History: See admission H&P  Past Medical History:  Past Medical History:  Diagnosis Date  . Anxiety   . Depression   . Edema    legs and feet  . PONV (postoperative nausea and vomiting)   . Seasonal allergies   . Sleep apnea   . SVD (spontaneous vaginal delivery)    x 2    Past Surgical History:  Procedure Laterality Date  . CESAREAN SECTION     x 1  . LAPAROSCOPIC ASSISTED VAGINAL HYSTERECTOMY N/A 04/13/2013   Procedure: LAPAROSCOPIC ASSISTED VAGINAL HYSTERECTOMY;  Surgeon: Marylynn Pearson, MD;  Location: [facility];  Service: Gynecology;  Laterality: N/A;  . NOVASURE ABLATION    . WISDOM TOOTH EXTRACTION     Family History: History reviewed. No pertinent family history. Family Psychiatric  History: See admission H&P Social History:  Social History   Substance and Sexual Activity  Alcohol Use Yes   Comment: socially     Social History   Substance and Sexual Activity  Drug Use No    Social History   Socioeconomic History  . Marital status: Married    Spouse name: Not on file  . Number of children: Not on file  .  Years of education: Not on file  . Highest education level: Not on file  Occupational History  . Not on file  Social Needs  . Financial resource strain: Not on file  . Food insecurity:    Worry: Not on file    Inability: Not on file  . Transportation needs:    Medical: Not on file    Non-medical: Not on file  Tobacco Use  . Smoking status: Never Smoker  . Smokeless tobacco: Never Used  Substance and Sexual Activity  . Alcohol use: Yes    Comment: socially  . Drug use: No  .  Sexual activity: Yes    Birth control/protection: None    Comment: husband vasectomy  Lifestyle  . Physical activity:    Days per week: Not on file    Minutes per session: Not on file  . Stress: Not on file  Relationships  . Social connections:    Talks on phone: Not on file    Gets together: Not on file    Attends religious service: Not on file    Active member of club or organization: Not on file    Attends meetings of clubs or organizations: Not on file    Relationship status: Not on file  Other Topics Concern  . Not on file  Social History Narrative  . Not on file   Additional Social History:                         Sleep: Good  Appetite:  Good  Current Medications: Current Facility-Administered Medications  Medication Dose Route Frequency Provider Last Rate Last Dose  . acetaminophen (TYLENOL) tablet 650 mg  650 mg Oral Q6H PRN Ethelene Hal, NP      . ALPRAZolam Duanne Moron) tablet 0.5 mg  0.5 mg Oral BID PRN Cobos, Myer Peer, MD   0.5 mg at 02/04/19 1030  . alum & mag hydroxide-simeth (MAALOX/MYLANTA) 200-200-20 MG/5ML suspension 30 mL  30 mL Oral Q4H PRN Ethelene Hal, NP      . DULoxetine (CYMBALTA) DR capsule 60 mg  60 mg Oral BID Cobos, Myer Peer, MD   60 mg at 02/04/19 0750  . magnesium hydroxide (MILK OF MAGNESIA) suspension 30 mL  30 mL Oral Daily PRN Ethelene Hal, NP      . traZODone (DESYREL) tablet 50 mg  50 mg Oral QHS PRN,MR X 1 Laverle Hobby, PA-C   50 mg at 02/03/19 2242    Lab Results:  Results for orders placed or performed during the hospital encounter of 02/03/19 (from the past 48 hour(s))  Hemoglobin A1c     Status: Abnormal   Collection Time: 02/04/19  6:21 AM  Result Value Ref Range   Hgb A1c MFr Bld 5.8 (H) 4.8 - 5.6 %    Comment: (NOTE) Pre diabetes:          5.7%-6.4% Diabetes:              >6.4% Glycemic control for   <7.0% adults with diabetes    Mean Plasma Glucose 119.76 mg/dL    Comment:  Performed at [facility], [place] [address], [place], [place] [address]  Lipid panel     Status: Abnormal   Collection Time: 02/04/19  6:21 AM  Result Value Ref Range   Cholesterol 269 (H) 0 - 200 mg/dL   Triglycerides 133 <150 mg/dL   HDL 70 >40 mg/dL   Total  CHOL/HDL Ratio 3.8 RATIO   VLDL 27 0 - 40 mg/dL   LDL Cholesterol 172 (H) 0 - 99 mg/dL    Comment:        Total Cholesterol/HDL:CHD Risk Coronary Heart Disease Risk Table                     Men   Women  1/2 Average Risk   3.4   3.3  Average Risk       5.0   4.4  2 X Average Risk   9.6   7.1  3 X Average Risk  23.4   11.0        Use the calculated Patient Ratio above and the CHD Risk Table to determine the patient's CHD Risk.        ATP III CLASSIFICATION (LDL):  <100     mg/dL   Optimal  100-129  mg/dL   Near or Above                    Optimal  130-159  mg/dL   Borderline  160-189  mg/dL   High  >190     mg/dL   Very High Performed at [facility] [address] [place], [place] [address]   TSH     Status: None   Collection Time: 02/04/19  6:21 AM  Result Value Ref Range   TSH 2.381 0.350 - 4.500 uIU/mL    Comment: Performed by a 3rd Generation assay with a functional sensitivity of <=0.01 uIU/mL. Performed at [facility], [place] [address], [place], [place] [address]     Blood Alcohol level:  Lab Results  Component Value Date   ETH <10 00/93/8182    Metabolic Disorder Labs: Lab Results  Component Value Date   HGBA1C 5.8 (H) 02/04/2019   MPG 119.76 02/04/2019   No results found for: PROLACTIN Lab Results  Component Value Date   CHOL 269 (H) 02/04/2019   TRIG 133 02/04/2019   HDL 70 02/04/2019   CHOLHDL 3.8 02/04/2019   VLDL 27 02/04/2019   LDLCALC 172 (H) 02/04/2019    Physical Findings: AIMS:  , ,  ,  ,    CIWA:    COWS:     Musculoskeletal: Strength & Muscle Tone: within normal limits Gait & Station: normal Patient leans: N/A  Psychiatric  Specialty Exam: Physical Exam  Nursing note and vitals reviewed. Constitutional: She is oriented to person, place, and time. She appears well-developed and well-nourished.  Cardiovascular: Normal rate.  Respiratory: Effort normal.  Neurological: She is alert and oriented to person, place, and time.  Psychiatric: Her behavior is normal.    Review of Systems  Constitutional: Negative.   Psychiatric/Behavioral: Positive for depression (improving). Negative for hallucinations, memory loss, substance abuse and suicidal ideas. The patient is not nervous/anxious and does not have insomnia.     Blood pressure 105/74, pulse 99, temperature 98 F (36.7 C), temperature source Oral, resp. rate 16, height 5\' 5"  (1.651 m), weight 96 kg, last menstrual period 03/17/2013, SpO2 99 %.Body mass index is 35.22 kg/m.  General Appearance: Casual  Eye Contact:  Good  Speech:  Normal Rate  Volume:  Normal  Mood:  Euthymic  Affect:  Congruent  Thought Process:  Coherent  Orientation:  Full (Time, Place, and Person)  Thought Content:  WDL  Suicidal Thoughts:  No  Homicidal Thoughts:  No  Memory:  Immediate;   Good Recent;  Good  Judgement:  Intact  Insight:  Fair  Psychomotor Activity:  Normal  Concentration:  Concentration: Good  Recall:  Good  Fund of Knowledge:  Good  Language:  Good  Akathisia:  No  Handed:  Right  AIMS (if indicated):     Assets:  Communication Skills Desire for Improvement Housing Social Support  ADL's:  Intact  Cognition:  WNL  Sleep:  Number of Hours: 5.75     Treatment Plan Summary: Daily contact with patient to assess and evaluate symptoms and progress in treatment and Medication management   Continue inpatient hospitalization.  Continue Cymbalta 60 mg PO BID for mood Continue trazodone 50 mg PO QHS PRN insomnia Continue Xanax 0.5 mg PO BID PRN anxiety/insomnia  Patient will participate in the therapeutic group milieu.  Discharge disposition in progress.    Connye Burkitt, NP 02/04/2019, 1:45 PM  Agree with NP progress note

## 2019-02-04 NOTE — Progress Notes (Signed)
Recreation Therapy Notes  Date:  3.20.20 Time: 0930 Location: 300 [address] Dayroom  Group Topic: Stress Management  Goal Area(s) Addresses:  Patient will identify positive stress management techniques. Patient will identify benefits of using stress management post d/c.  Intervention:  Stress Management   Activity :  Meditation.  LRT introduced patients to the stress management technique of meditation.  LRT played a meditation that focused on making the most of your day and the possibilities that can be had.  Patients were to follow along as meditation played to engage in activity.   Education:  Stress Management, Discharge Planning.   Education Outcome: Acknowledges Education  Clinical Observations/Feedback: Pt did not attend group.      Victorino Sparrow, LRT/CTRS         Victorino Sparrow A 02/04/2019 11:04 AM

## 2019-02-04 NOTE — Plan of Care (Addendum)
Patient self inventory- Patient slept well last night, sleep medication was requested and was helpful. Concentration good, energy level low. Denies withdrawal symptoms. Denies physical pain. Denies SI HI AVH. Goal is: to go home and participate and have a positive attitude. Patient is compliant with medications prescribed; no side effects noted. Safety is maintained with 15 minute checks as well as environmental checks. Will continue to monitor and assess.  Patient said she is ready to leave, although she was told it would probably not be today. Pt seems to have poor insight and is thinking superficially.   Problem: Education: Goal: Mental status will improve Outcome: Progressing Goal: Verbalization of understanding the information provided will improve Outcome: Progressing   Problem: Activity: Goal: Interest or engagement in activities will improve Outcome: Progressing Goal: Sleeping patterns will improve Outcome: Progressing   Problem: Coping: Goal: Ability to verbalize frustrations and anger appropriately will improve Outcome: Progressing

## 2019-02-04 NOTE — Tx Team (Signed)
Interdisciplinary Treatment and Diagnostic Plan Update  02/04/2019 Time of Session:  Angelica Morgan MRN: [id number]  Principal Diagnosis: <principal problem not specified>  Secondary Diagnoses: Active Problems:   MDD (major depressive disorder), single episode, severe , no psychosis ([place])   Current Medications:  Current Facility-Administered Medications  Medication Dose Route Frequency Provider Last Rate Last Dose  . acetaminophen (TYLENOL) tablet 650 mg  650 mg Oral Q6H PRN Ethelene Hal, NP      . ALPRAZolam Duanne Moron) tablet 0.5 mg  0.5 mg Oral BID PRN Cobos, Myer Peer, MD   0.5 mg at 02/03/19 2112  . alum & mag hydroxide-simeth (MAALOX/MYLANTA) 200-200-20 MG/5ML suspension 30 mL  30 mL Oral Q4H PRN Ethelene Hal, NP      . DULoxetine (CYMBALTA) DR capsule 60 mg  60 mg Oral BID Cobos, Myer Peer, MD   60 mg at 02/04/19 0750  . magnesium hydroxide (MILK OF MAGNESIA) suspension 30 mL  30 mL Oral Daily PRN Ethelene Hal, NP      . traZODone (DESYREL) tablet 50 mg  50 mg Oral QHS PRN,MR X 1 Laverle Hobby, PA-C   50 mg at 02/03/19 2242   PTA Medications: Medications Prior to Admission  Medication Sig Dispense Refill Last Dose  . ALPRAZolam (XANAX XR) 0.5 MG 24 hr tablet Take 0.5 mg by mouth at bedtime as needed for anxiety or sleep.    02/02/2019 at Unknown time  . Cranberry 450 MG CAPS Take 1 capsule by mouth 2 (two) times daily.   02/02/2019 at Unknown time  . DULoxetine (CYMBALTA) 30 MG capsule Take 30 mg by mouth daily.   02/02/2019 at Unknown time  . DULoxetine (CYMBALTA) 60 MG capsule Take 60 mg by mouth daily.   02/02/2019 at Unknown time  . fluticasone (FLONASE) 50 MCG/ACT nasal spray Place 1 spray into both nostrils 2 (two) times daily.   02/02/2019 at Unknown time  . Green Tea 200 MG CAPS Take 2 capsules by mouth 2 (two) times daily.   02/02/2019 at Unknown time  . hydrochlorothiazide (HYDRODIURIL) 25 MG tablet Take 25 mg by mouth daily.   02/02/2019 at Unknown  time  . ibuprofen (ADVIL,MOTRIN) 600 MG tablet Take 1 tablet (600 mg total) by mouth every 6 (six) hours as needed. (Patient not taking: Reported on 02/03/2019) 30 tablet 0 Completed Course at Unknown time  . Multiple Vitamin (MULTIVITAMIN WITH MINERALS) TABS Take 1 tablet by mouth daily.   02/02/2019 at Unknown time  . Multiple Vitamins-Iron (ONE-TABLET-DAILY/IRON PO) Take 1 tablet by mouth daily.   02/02/2019 at Unknown time  . Naphazoline-Polyethyl Glycol (EYE DROPS) 0.012-0.2 % SOLN Apply 1 drop to eye daily as needed (dry eyes).   unknown  . oxyCODONE-acetaminophen (PERCOCET/ROXICET) 5-325 MG per tablet Take 1-2 tablets by mouth every 4 (four) hours as needed. (Patient not taking: Reported on 02/03/2019) 30 tablet 0 Completed Course at Unknown time    Patient Stressors: Marital or family conflict  Patient Strengths: Ability for insight Active sense of humor Average or above average intelligence Capable of independent living  Treatment Modalities: Medication Management, Group therapy, Case management,  1 to 1 session with clinician, Psychoeducation, Recreational therapy.   Physician Treatment Plan for Primary Diagnosis: <principal problem not specified> Long Term Goal(s): Improvement in symptoms so as ready for discharge Improvement in symptoms so as ready for discharge   Short Term Goals: Ability to identify changes in lifestyle to reduce recurrence of condition will improve Ability to  verbalize feelings will improve Ability to disclose and discuss suicidal ideas Ability to demonstrate self-control will improve Ability to identify and develop effective coping behaviors will improve Ability to maintain clinical measurements within normal limits will improve Ability to identify changes in lifestyle to reduce recurrence of condition will improve Ability to verbalize feelings will improve Ability to disclose and discuss suicidal ideas Ability to demonstrate self-control will  improve Ability to identify and develop effective coping behaviors will improve Ability to maintain clinical measurements within normal limits will improve  Medication Management: Evaluate patient's response, side effects, and tolerance of medication regimen.  Therapeutic Interventions: 1 to 1 sessions, Unit Group sessions and Medication administration.  Evaluation of Outcomes: Not Met  Physician Treatment Plan for Secondary Diagnosis: Active Problems:   MDD (major depressive disorder), single episode, severe , no psychosis ([place])  Long Term Goal(s): Improvement in symptoms so as ready for discharge Improvement in symptoms so as ready for discharge   Short Term Goals: Ability to identify changes in lifestyle to reduce recurrence of condition will improve Ability to verbalize feelings will improve Ability to disclose and discuss suicidal ideas Ability to demonstrate self-control will improve Ability to identify and develop effective coping behaviors will improve Ability to maintain clinical measurements within normal limits will improve Ability to identify changes in lifestyle to reduce recurrence of condition will improve Ability to verbalize feelings will improve Ability to disclose and discuss suicidal ideas Ability to demonstrate self-control will improve Ability to identify and develop effective coping behaviors will improve Ability to maintain clinical measurements within normal limits will improve     Medication Management: Evaluate patient's response, side effects, and tolerance of medication regimen.  Therapeutic Interventions: 1 to 1 sessions, Unit Group sessions and Medication administration.  Evaluation of Outcomes: Not Met   RN Treatment Plan for Primary Diagnosis: <principal problem not specified> Long Term Goal(s): Knowledge of disease and therapeutic regimen to maintain health will improve  Short Term Goals: Ability to demonstrate self-control, Ability to  participate in decision making will improve, Ability to verbalize feelings will improve, Ability to disclose and discuss suicidal ideas, Ability to identify and develop effective coping behaviors will improve and Compliance with prescribed medications will improve  Medication Management: RN will administer medications as ordered by provider, will assess and evaluate patient's response and provide education to patient for prescribed medication. RN will report any adverse and/or side effects to prescribing provider.  Therapeutic Interventions: 1 on 1 counseling sessions, Psychoeducation, Medication administration, Evaluate responses to treatment, Monitor vital signs and CBGs as ordered, Perform/monitor CIWA, COWS, AIMS and Fall Risk screenings as ordered, Perform wound care treatments as ordered.  Evaluation of Outcomes: Not Met   LCSW Treatment Plan for Primary Diagnosis: <principal problem not specified> Long Term Goal(s): Safe transition to appropriate next level of care at discharge, Engage patient in therapeutic group addressing interpersonal concerns.  Short Term Goals: Engage patient in aftercare planning with referrals and resources  Therapeutic Interventions: Assess for all discharge needs, 1 to 1 time with Social worker, Explore available resources and support systems, Assess for adequacy in community support network, Educate family and significant other(s) on suicide prevention, Complete Psychosocial Assessment, Interpersonal group therapy.  Evaluation of Outcomes: Not Met   Progress in Treatment: Attending groups: No. New to unit  Participating in groups: No. Taking medication as prescribed: Yes. Toleration medication: Yes. Family/Significant other contact made: No, will contact:  if patient consents to collateral contacts Patient understands diagnosis: Yes. Discussing patient identified  problems/goals with staff: Yes. Medical problems stabilized or resolved: Yes. Denies  suicidal/homicidal ideation: Yes. Issues/concerns per patient self-inventory: No. Other:   New problem(s) identified: None   New Short Term/Long Term Goal(s):medication stabilization, elimination of SI thoughts, development of comprehensive mental wellness plan.    Patient Goals: Help with insecurities, depression and anxiety   Discharge Plan or Barriers: CSW will continue to follow and assess for appropriate referrals and possible discharge planning.   Reason for Continuation of Hospitalization: Anxiety Depression Medication stabilization Suicidal ideation  Estimated Length of Stay: 02/07/2019  Attendees: Patient: 02/04/2019 8:37 AM  Physician: Dr. Neita Garnet, MD 02/04/2019 8:37 AM  Nursing: Yetta Flock.Lenna Sciara RN 02/04/2019 8:37 AM  RN Care Manager: 02/04/2019 8:37 AM  Social Worker: Radonna Ricker, Latanya Presser 02/04/2019 8:37 AM  Recreational Therapist:  02/04/2019 8:37 AM  Other: Harriett Sine, NP  02/04/2019 8:37 AM  Other:  02/04/2019 8:37 AM  Other: 02/04/2019 8:37 AM    Scribe for Treatment Team: Marylee Floras, [place] 02/04/2019 8:37 AM

## 2019-02-04 NOTE — [facility] Suicide Risk Assessment (Signed)
[facility] INPATIENT:  Family/Significant Other Suicide Prevention Education  Suicide Prevention Education:  Education Completed; with husband, Ysenia Filice [phone]) has been identified by the patient as the family member/significant other with whom the patient will be residing, and identified as the person(s) who will aid the patient in the event of a mental health crisis (suicidal ideations/suicide attempt).  With written consent from the patient, the family member/significant other has been provided the following suicide prevention education, prior to the and/or following the discharge of the patient.  The suicide prevention education provided includes the following:  Suicide risk factors  Suicide prevention and interventions  National Suicide Hotline telephone number  [facility] assessment telephone number  [facility] Emergency Assistance [place] and/or Residential Mobile Crisis Unit telephone number  Request made of family/significant other to:  Remove weapons (e.g., guns, rifles, knives), all items previously/currently identified as safety concern.    Remove drugs/medications (over-the-counter, prescriptions, illicit drugs), all items previously/currently identified as a safety concern.  The family member/significant other verbalizes understanding of the suicide prevention education information provided.  The family member/significant other agrees to remove the items of safety concern listed above.  Patient's husband reports that he spoke to his wife and she sounded much better when they spoke on the telephone. Patient's husband reports that the wife's strained relationship with her two adult daughters is her main issue. He states that he has no concerns or questions regarding the patient discharging, as long as she "better". CSW will continue to follow.     Marylee Floras 02/04/2019, 1:18 PM
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
## 2019-02-05 MED ORDER — DULOXETINE HCL 60 MG PO CPEP: 60.0000 mg | ORAL_CAPSULE | Freq: Two times a day (BID) | ORAL | 0 refills | Status: AC

## 2019-02-05 MED ORDER — TRAZODONE HCL 50 MG PO TABS: 50.0000 mg | ORAL_TABLET | Freq: Every evening | ORAL | 0 refills | Status: DC | PRN
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
## 2019-02-05 NOTE — Progress Notes (Signed)
D. Pt friendly upon approach - smiling- reports that she is ready to go home. Per pt's self inventory, pt rates her depression, hopelessness and anxiety all 0's.  Pt currently denies pain and suicidal ideations. A. Labs and vitals monitored. Pt compliant with medications. Pt supported emotionally and encouraged to express concerns and ask questions.   R. Pt remains safe with 15 minute checks. Will continue POC.

## 2019-02-05 NOTE — [facility] Suicide Risk Assessment (Signed)
[facility] Discharge Suicide Risk Assessment   Principal Problem: MDD (major depressive disorder), single episode, severe , no psychosis ([place]) Discharge Diagnoses: Principal Problem:   MDD (major depressive disorder), single episode, severe , no psychosis ([place])   Total Time spent with patient: 30 minutes  Musculoskeletal: Strength & Muscle Tone: within normal limits Gait & Station: normal Patient leans: N/A  Psychiatric Specialty Exam: ROS no headache, no chest pain, no shortness of breath, no myalgias or aches, no fever, no chills   Blood pressure 104/73, pulse 87, temperature 98.5 F (36.9 C), temperature source Oral, resp. rate 16, height 5\' 5"  (1.651 m), weight 96 kg, last menstrual period 03/17/2013, SpO2 99 %.Body mass index is 35.22 kg/m.  General Appearance: Well Groomed  Eye Contact::  Good  Speech:  Normal Rate409  Volume:  Normal  Mood:  euthymic  Affect:  reactive, appropriate  Thought Process:  Linear and Descriptions of Associations: Intact  Orientation:  Full (Time, Place, and Person)  Thought Content:  no hallucinations, no delusions  Suicidal Thoughts:  No denies any suicidal or self injurious ideations, denies homicidal or violent ideations  Homicidal Thoughts:  No  Memory:  recent and remote grossly intact   Judgement:  Other:  improving   Insight:  Fair and improving  Psychomotor Activity:  Normal  Concentration:  Good  Recall:  Good  Fund of Knowledge:Good  Language: Good  Akathisia:  Negative  Handed:  Right  AIMS (if indicated):     Assets:  Communication Skills Desire for Improvement Resilience Social Support  Sleep:  Number of Hours: 6.5  Cognition: WNL  ADL's:  Intact   Mental Status Per Nursing Assessment::   On Admission:  NA  Demographic Factors:  34, married, has three adult children  Loss Factors: Marital/family tension  Historical Factors: No prior psychiatric admissions, no prior suicidal attempts, denies history of severe  depressive episodes   Risk Reduction Factors:   Sense of responsibility to family, Living with another person, especially a relative, Positive social support and Positive coping skills or problem solving skills  Continued Clinical Symptoms:  Currently presents alert, attentive, well-groomed, mood described as "normal", does appear euthymic today.  Affect appropriate, full in range.  No thought disorder.  Denies suicidal or self-injurious ideations.  Denies homicidal or violent ideations.  No hallucinations, no delusions Currently denies medication side effects. Behavior on unit in good control, pleasant on approach. At this time declined IOP referral, prefers individual psychotherapy, plans to continue psychiatric medication management with Dr. Toy Care   Cognitive Features That Contribute To Risk:  No gross cognitive deficits noted upon discharge. Is alert , attentive, and oriented x 3   Suicide Risk:  Mild:  Suicidal ideation of limited frequency, intensity, duration, and specificity.  There are no identifiable plans, no associated intent, mild dysphoria and related symptoms, good self-control (both objective and subjective assessment), few other risk factors, and identifiable protective factors, including available and accessible social support.  Follow-up Information    Chucky May, MD Follow up on 03/14/2019.   Specialty:  Psychiatry Why:  Medication management appointment is Monday, 4/27 at 9:45a.  Please bring your current medications and discharge paperwork from this hospitalization.   Office will contact you if an earlier appointment becomes available.  Contact information: [place] [address] [phone]        Doroteo Glassman, MD Follow up on 02/10/2019.   Specialty:  Neurology Why:  Your next appointment  with Dr. Redmond Pulling is Thursday, 3/26 at 12:00p.  Your appointment will be conducted remotely.  Please download the application  Zoom video chat before your appointment.  Contact information: [place] [address] [phone]           Plan Of Care/Follow-up recommendations:  Activity:  as tolerated Diet:  regular Tests:  NA Other:  see below Patient is expressing readiness for discharge and there are no current grounds for involuntary commitment Leaving unit in good spirits, states husband will pick her up later today Plans to return home Plans to follow up as above - sees Dr. Toy Care and Dr. Redmond Pulling ( therapist/not neurologist ) for individual therapy Has an established PCP at [facility] for medical management as needed  Jenne Campus, MD 02/05/2019, 8:17 AM

## 2019-02-05 NOTE — Progress Notes (Signed)
D: Patient observed reading in room this evening. Patient states she is discharging tomorrow and asks that she be able to leave as early as possible. Patient's affect animated, mood anxious, pleasant. Denies pain, physical complaints.   A: Medicated per orders, prn trazadone given per her request. Medication education provided. Level III obs in place for safety. Emotional support offered. Patient encouraged to complete Suicide Safety Plan before discharge. Encouraged to attend and participate in unit programming.   R: Patient verbalizes understanding of POC. On reassess, patient was asleep. Patient denies SI/HI/AVH and remains safe on level III obs. Will continue to monitor throughout the night.

## 2019-02-05 NOTE — Progress Notes (Signed)
Pt discharged to lobby- husband and sister present to pick up patient. Pt was stable and appreciative at that time. All papers and prescriptions were given and valuables returned. Verbal understanding expressed. Denies SI/HI and A/VH. Pt given opportunity to express concerns and ask questions.

## 2019-02-05 NOTE — Discharge Summary (Addendum)
Physician Discharge Summary Note  Patient:  Angelica Morgan is an 53 y.o., female MRN:  [id number] DOB:  01-18-1966 Patient phone:  [phone] (home)  Patient address:   [address] [place] [address],  Total Time spent with patient: 45 minutes  Date of Admission:  02/03/2019 Date of Discharge: 02/05/2019  Reason for Admission:    53 year old female. Presented to hospital via EMS. Reports history of marital tension , recently exacerbated . States there she had prior concerns about infidelity during his work trips, but states this issue had been worked on and was no longer bothering her . States one of her daughters , who was visiting, brought it up again recently, leading to husband becoming angry. . She also reports other recent stressors, and had recently returned from a trip to [place] where she attended funeral of a family member and recently found out one of her brothers in law has brain cancer .She reports husband yesterday told her that " either I go or he goes". After this patient felt acutely upset , depressed, and overdosed on Oxycodone ( from husband's prior prescription) and Zolpidem.States " I only took two Oxycodones and one Zolpidem". States her intention at the time was " to die". States " I felt it was just easier for all of [place] if I just died ". She did write a suicide note.   States that she has had some depression , but was not having any suicidal ideations prior to above , and also minimizes neuro-vegetative symptoms of depression, states sleep, appetite, energy have been normal and does not endorse anhedonia. Also denies any psychotic symptoms. Associated Signs/Symptoms: Depression Symptoms:  suicidal attempt, describes decreased attention recently (Hypo) Manic Symptoms: none noted or endorsed  Anxiety Symptoms: reports some increased anxiety related to marital concerns Psychotic Symptoms:  Denies  PTSD Symptoms: Denies   Past Psychiatric History: no prior  psychiatric admissions , no history of past suicide attempts, no history of self cutting, no history of psychosis, no history of mania or hypomania. Reports history of panic attacks, does not endorse agoraphobia. No clear history of Social Phobia.  Does not endorse history of PTSD.   Substance Abuse History in the last 12 months:  States she drinks one to two drinks on weekends, denies history of drug abuse, is prescribed Xanax, denies abusing or misusing , states she has never run out early .   Consequences of Substance Abuse: Denies   Previous Psychotropic Medications: Cymbalta 90 mgrs QDAY, has been on it for several years, Xanax 0.5 mgrs QDAY for anxiety. Has been on it for several years . Remembers she was on Latuda, Trintellix, Zoloft in the past, and does not feel these worked well for her.   Psychological Evaluations: No   Past Medical History: Denies medical illnesses .NKDA. Takes HCTZ for peripheral edema at times . States takes only occasionally.  Principal Problem: MDD (major depressive disorder), single episode, severe , no psychosis ([place]) Discharge Diagnoses: Principal Problem:   MDD (major depressive disorder), single episode, severe , no psychosis ([place])   Past Medical History:  Past Medical History:  Diagnosis Date  . Anxiety   . Depression   . Edema    legs and feet  . PONV (postoperative nausea and vomiting)   . Seasonal allergies   . Sleep apnea   . SVD (spontaneous vaginal delivery)    x 2    Past Surgical History:  Procedure Laterality Date  . CESAREAN SECTION  x 1  . LAPAROSCOPIC ASSISTED VAGINAL HYSTERECTOMY N/A 04/13/2013   Procedure: LAPAROSCOPIC ASSISTED VAGINAL HYSTERECTOMY;  Surgeon: Marylynn Pearson, MD;  Location: [facility];  Service: Gynecology;  Laterality: N/A;  . NOVASURE ABLATION    . WISDOM TOOTH EXTRACTION     Family History: History reviewed. No pertinent family history. Family Psychiatric  History: maternal grandfather committed  suicide. No history of substance abuse or alcohol abuse in family. States a Angelica has history of depression.  Social History:  Social History   Substance and Sexual Activity  Alcohol Use Yes   Comment: socially     Social History   Substance and Sexual Activity  Drug Use No    Social History   Socioeconomic History  . Marital status: Married    Spouse name: Not on file  . Number of children: Not on file  . Years of education: Not on file  . Highest education level: Not on file  Occupational History  . Not on file  Social Needs  . Financial resource strain: Not on file  . Food insecurity:    Worry: Not on file    Inability: Not on file  . Transportation needs:    Medical: Not on file    Non-medical: Not on file  Tobacco Use  . Smoking status: Never Smoker  . Smokeless tobacco: Never Used  Substance and Sexual Activity  . Alcohol use: Yes    Comment: socially  . Drug use: No  . Sexual activity: Yes    Birth control/protection: None    Comment: husband vasectomy  Lifestyle  . Physical activity:    Days per week: Not on file    Minutes per session: Not on file  . Stress: Not on file  Relationships  . Social connections:    Talks on phone: Not on file    Gets together: Not on file    Attends religious service: Not on file    Active member of club or organization: Not on file    Attends meetings of clubs or organizations: Not on file    Relationship status: Not on file  Other Topics Concern  . Not on file  Social History Narrative  . Not on file    Hospital Course:  Angelica Morgan was admitted for MDD (major depressive disorder), single episode, severe , no psychosis ([place]) and crisis management.  She was treated with the following medications Duloxetine 60mg  po BID for depression.  Angelica Morgan was discharged with current medication and was instructed on how to take medications as prescribed; (details listed below under Medication List).  Medical problems were  identified and treated as needed.  Home medications were restarted as appropriate.  Improvement was monitored by observation and Angelica Morgan daily report of symptom reduction.  Emotional and mental status was monitored by daily self-inventory reports completed by Angelica Morgan and clinical staff.         Angelica Morgan was evaluated by the treatment team for stability and plans for continued recovery upon discharge.  Angelica Morgan motivation was an integral factor for scheduling further treatment.  Employment, transportation, bed availability, health status, family support, and any pending legal issues were also considered during her hospital stay.  She was offered further treatment options upon discharge including but not limited to Residential, Intensive Outpatient, and Outpatient treatment.  Angelica Morgan will follow up with the services as listed below under Follow Up Information.     Upon completion of this admission the Angelica Morgan  was both mentally and medically stable for discharge denying suicidal/homicidal ideation, auditory/visual/tactile hallucinations, delusional thoughts and paranoia.      Musculoskeletal: Strength & Muscle Tone: within normal limits Gait & Station: normal Patient leans: N/A  Psychiatric Specialty Exam: See MD SRA Physical Exam  ROS  Blood pressure 104/73, pulse 87, temperature 98.5 F (36.9 C), temperature source Oral, resp. rate 16, height 5\' 5"  (1.651 m), weight 96 kg, last menstrual period 03/17/2013, SpO2 99 %.Body mass index is 35.22 kg/m.  Sleep:  Number of Hours: 6.5     Have you used any form of tobacco in the last 30 days? (Cigarettes, Smokeless Tobacco, Cigars, and/or Pipes): No  Has this patient used any form of tobacco in the last 30 days? (Cigarettes, Smokeless Tobacco, Cigars, and/or Pipes) Yes, No  Blood Alcohol level:  Lab Results  Component Value Date   ETH <10 16/11/930    Metabolic Disorder Labs:  Lab Results  Component Value  Date   HGBA1C 5.8 (H) 02/04/2019   MPG 119.76 02/04/2019   No results found for: PROLACTIN Lab Results  Component Value Date   CHOL 269 (H) 02/04/2019   TRIG 133 02/04/2019   HDL 70 02/04/2019   CHOLHDL 3.8 02/04/2019   VLDL 27 02/04/2019   LDLCALC 172 (H) 02/04/2019    See Psychiatric Specialty Exam and Suicide Risk Assessment completed by Attending Physician prior to discharge.  Discharge destination:  Home  Is patient on multiple antipsychotic therapies at discharge:  No   Has Patient had three or more failed trials of antipsychotic monotherapy by history:  No  Recommended Plan for Multiple Antipsychotic Therapies: NA  Discharge Instructions    Discharge instructions   Complete by:  As directed    Please continue to take medications as directed. If your symptoms return, worsen, or persist please call your 911, report to local ER, or contact crisis hotline. Please do not drink alcohol or use any illegal substances while taking prescription medications.     Allergies as of 02/05/2019      Reactions   Anesthetic [benzocaine] Nausea And Vomiting      Medication List    STOP taking these medications   Cranberry 450 MG Caps   Green Tea 200 MG Caps   hydrochlorothiazide 25 MG tablet Commonly known as:  HYDRODIURIL   ibuprofen 600 MG tablet Commonly known as:  ADVIL,MOTRIN   ONE-TABLET-DAILY/IRON PO   oxyCODONE-acetaminophen 5-325 MG tablet Commonly known as:  PERCOCET/ROXICET     TAKE these medications     Indication  ALPRAZolam 0.5 MG 24 hr tablet Commonly known as:  XANAX XR Take 0.5 mg by mouth at bedtime as needed for anxiety or sleep.  Indication:  anxiety   DULoxetine 60 MG capsule Commonly known as:  CYMBALTA Take 1 capsule (60 mg total) by mouth 2 (two) times daily. What changed:    when to take this  Another medication with the same name was removed. Continue taking this medication, and follow the directions you see here.  Indication:   Generalized Anxiety Disorder, Major Depressive Disorder   Eye Drops 0.012-0.2 % Soln Generic drug:  Naphazoline-Polyethyl Glycol Apply 1 drop to eye daily as needed (dry eyes).  Indication:  Eye Itching   fluticasone 50 MCG/ACT nasal spray Commonly known as:  FLONASE Place 1 spray into both nostrils 2 (two) times daily.  Indication:  Allergic Rhinitis   multivitamin with minerals Tabs tablet Take 1 tablet by mouth daily.  Indication:  vitamin  deficiency   traZODone 50 MG tablet Commonly known as:  DESYREL Take 1 tablet (50 mg total) by mouth at bedtime as needed and may repeat dose one time if needed for sleep.  Indication:  Trouble Sleeping      Follow-up Information    Chucky May, MD Follow up on 03/14/2019.   Specialty:  Psychiatry Why:  Medication management appointment is Monday, 4/27 at 9:45a.  Please bring your current medications and discharge paperwork from this hospitalization.   Office will contact you if an earlier appointment becomes available.  Contact information: [address] [place] [address] [phone]        Doroteo Glassman, MD Follow up on 02/10/2019.   Specialty:  Neurology Why:  Your next appointment with Dr. Redmond Pulling is Thursday, 3/26 at 12:00p.  Your appointment will be conducted remotely.  Please download the application Zoom video chat before your appointment.  Contact information: [place] [address] [phone]           Follow-up recommendations:  Activity:  Increase activity as tolerated Diet:  Routine diet as directed Tests:   Routine tests as directed.  Other:  Even if you begin to feel better please continue to take your medications.   Signed: Suella Broad, FNP 02/05/2019, 9:31 AM   Patient seen, Suicide Assessment Completed.  Disposition Plan Reviewed

## 2019-02-05 NOTE — Progress Notes (Signed)
  [facility] Adult Case Management Discharge Plan :  Will you be returning to the same living situation after discharge:  Yes,  home At discharge, do you have transportation home?: Yes,  husband picking up at 11am Do you have the ability to pay for your medications: Yes,  [employer] insurance  Release of information consent forms completed and in the chart. Patient to Follow up at: Follow-up Information    Chucky May, MD Follow up on 03/14/2019.   Specialty:  Psychiatry Why:  Medication management appointment is Monday, 4/27 at 9:45a.  Please bring your current medications and discharge paperwork from this hospitalization.   Office will contact you if an earlier appointment becomes available.  Contact information: [address] [place] [address] [phone]        Doroteo Glassman, MD Follow up on 02/10/2019.   Specialty:  Neurology Why:  Your next appointment with Dr. Redmond Pulling is Thursday, 3/26 at 12:00p.  Your appointment will be conducted remotely.  Please download the application Zoom video chat before your appointment.  Contact information: [place] [address] [phone]           Next level of care provider has access to [place] and Suicide Prevention discussed: Yes,  with husband  Have you used any form of tobacco in the last 30 days? (Cigarettes, Smokeless Tobacco, Cigars, and/or Pipes): No  Has patient been referred to the Quitline?: N/A patient is not a smoker  Patient has been referred for addiction treatment: Yes  Joellen Jersey, [place] 02/05/2019, 9:45 AM

## 2019-02-05 NOTE — Progress Notes (Signed)
Adult Psychoeducational Group Note  Date:  02/05/2019 Time:  4:29 AM  Group Topic/Focus:  Wrap-Up Group:   The focus of this group is to help patients review their daily goal of treatment and discuss progress on daily workbooks.  Participation Level:  Active  Participation Quality:  Appropriate and Attentive  Affect:  Appropriate  Cognitive:  Alert and Appropriate  Insight: Appropriate and Good  Engagement in Group:  Engaged  Modes of Intervention:  Discussion  Additional Comments:  Pt attend wrap up group. Her day was a 10. The one positive thing that happened  to her she gets to go home tomorrow.  Lenice Llamas Long 02/05/2019, 4:29 AM

## 2019-02-07 DIAGNOSIS — F3112 Bipolar disorder, current episode manic without psychotic features, moderate: Secondary | ICD-10-CM | POA: Diagnosis not present

## 2019-02-10 DIAGNOSIS — F3112 Bipolar disorder, current episode manic without psychotic features, moderate: Secondary | ICD-10-CM | POA: Diagnosis not present

## 2019-02-14 DIAGNOSIS — F3112 Bipolar disorder, current episode manic without psychotic features, moderate: Secondary | ICD-10-CM | POA: Diagnosis not present

## 2019-02-17 DIAGNOSIS — F3112 Bipolar disorder, current episode manic without psychotic features, moderate: Secondary | ICD-10-CM | POA: Diagnosis not present

## 2019-02-21 DIAGNOSIS — F411 Generalized anxiety disorder: Secondary | ICD-10-CM | POA: Diagnosis not present

## 2019-02-21 DIAGNOSIS — F4321 Adjustment disorder with depressed mood: Secondary | ICD-10-CM | POA: Diagnosis not present

## 2019-02-21 DIAGNOSIS — F3112 Bipolar disorder, current episode manic without psychotic features, moderate: Secondary | ICD-10-CM | POA: Diagnosis not present

## 2019-02-24 DIAGNOSIS — F3112 Bipolar disorder, current episode manic without psychotic features, moderate: Secondary | ICD-10-CM | POA: Diagnosis not present

## 2019-03-01 DIAGNOSIS — F3112 Bipolar disorder, current episode manic without psychotic features, moderate: Secondary | ICD-10-CM | POA: Diagnosis not present

## 2019-03-03 DIAGNOSIS — M2012 Hallux valgus (acquired), left foot: Secondary | ICD-10-CM | POA: Diagnosis not present

## 2019-03-03 DIAGNOSIS — F3112 Bipolar disorder, current episode manic without psychotic features, moderate: Secondary | ICD-10-CM | POA: Diagnosis not present

## 2019-03-07 DIAGNOSIS — F3112 Bipolar disorder, current episode manic without psychotic features, moderate: Secondary | ICD-10-CM | POA: Diagnosis not present

## 2019-03-11 DIAGNOSIS — M2141 Flat foot [pes planus] (acquired), right foot: Secondary | ICD-10-CM | POA: Diagnosis not present

## 2019-03-11 DIAGNOSIS — M19071 Primary osteoarthritis, right ankle and foot: Secondary | ICD-10-CM | POA: Diagnosis not present

## 2019-03-14 DIAGNOSIS — F3112 Bipolar disorder, current episode manic without psychotic features, moderate: Secondary | ICD-10-CM | POA: Diagnosis not present

## 2019-03-21 DIAGNOSIS — F3112 Bipolar disorder, current episode manic without psychotic features, moderate: Secondary | ICD-10-CM | POA: Diagnosis not present

## 2019-03-24 DIAGNOSIS — F3112 Bipolar disorder, current episode manic without psychotic features, moderate: Secondary | ICD-10-CM | POA: Diagnosis not present

## 2019-03-28 DIAGNOSIS — F3112 Bipolar disorder, current episode manic without psychotic features, moderate: Secondary | ICD-10-CM | POA: Diagnosis not present

## 2019-04-04 DIAGNOSIS — F3112 Bipolar disorder, current episode manic without psychotic features, moderate: Secondary | ICD-10-CM | POA: Diagnosis not present

## 2019-04-12 DIAGNOSIS — F3112 Bipolar disorder, current episode manic without psychotic features, moderate: Secondary | ICD-10-CM | POA: Diagnosis not present

## 2019-04-15 DIAGNOSIS — Z6836 Body mass index (BMI) 36.0-36.9, adult: Secondary | ICD-10-CM | POA: Diagnosis not present

## 2019-04-15 DIAGNOSIS — Z1231 Encounter for screening mammogram for malignant neoplasm of breast: Secondary | ICD-10-CM | POA: Diagnosis not present

## 2019-04-15 DIAGNOSIS — Z01419 Encounter for gynecological examination (general) (routine) without abnormal findings: Secondary | ICD-10-CM | POA: Diagnosis not present

## 2019-04-18 ENCOUNTER — Other Ambulatory Visit: Payer: Self-pay | Admitting: Obstetrics and Gynecology

## 2019-04-18 DIAGNOSIS — R928 Other abnormal and inconclusive findings on diagnostic imaging of breast: Secondary | ICD-10-CM

## 2019-04-19 DIAGNOSIS — F3112 Bipolar disorder, current episode manic without psychotic features, moderate: Secondary | ICD-10-CM | POA: Diagnosis not present

## 2019-04-20 ENCOUNTER — Ambulatory Visit
Admission: RE | Admit: 2019-04-20 | Discharge: 2019-04-20 | Disposition: A | Discharge status: Home / Self Care | Payer: No Typology Code available for payment source | Source: Ambulatory Visit | Attending: Obstetrics and Gynecology | Admitting: Obstetrics and Gynecology

## 2019-04-20 ENCOUNTER — Other Ambulatory Visit: Payer: Self-pay

## 2019-04-20 ENCOUNTER — Ambulatory Visit
Admission: RE | Admit: 2019-04-20 | Discharge: 2019-04-20 | Disposition: A | Discharge status: Home / Self Care | Payer: BC Managed Care – PPO | Source: Ambulatory Visit | Attending: Obstetrics and Gynecology | Admitting: Obstetrics and Gynecology

## 2019-04-20 DIAGNOSIS — R928 Other abnormal and inconclusive findings on diagnostic imaging of breast: Secondary | ICD-10-CM

## 2019-04-25 ENCOUNTER — Other Ambulatory Visit: Payer: No Typology Code available for payment source

## 2019-04-26 DIAGNOSIS — F3112 Bipolar disorder, current episode manic without psychotic features, moderate: Secondary | ICD-10-CM | POA: Diagnosis not present

## 2019-05-05 DIAGNOSIS — F3112 Bipolar disorder, current episode manic without psychotic features, moderate: Secondary | ICD-10-CM | POA: Diagnosis not present

## 2019-05-17 DIAGNOSIS — F3112 Bipolar disorder, current episode manic without psychotic features, moderate: Secondary | ICD-10-CM | POA: Diagnosis not present

## 2019-05-23 DIAGNOSIS — F3112 Bipolar disorder, current episode manic without psychotic features, moderate: Secondary | ICD-10-CM | POA: Diagnosis not present

## 2019-05-30 DIAGNOSIS — F3112 Bipolar disorder, current episode manic without psychotic features, moderate: Secondary | ICD-10-CM | POA: Diagnosis not present

## 2019-06-07 DIAGNOSIS — F3112 Bipolar disorder, current episode manic without psychotic features, moderate: Secondary | ICD-10-CM | POA: Diagnosis not present

## 2019-06-08 DIAGNOSIS — F3342 Major depressive disorder, recurrent, in full remission: Secondary | ICD-10-CM | POA: Diagnosis not present

## 2019-06-21 DIAGNOSIS — F3112 Bipolar disorder, current episode manic without psychotic features, moderate: Secondary | ICD-10-CM | POA: Diagnosis not present

## 2019-06-28 DIAGNOSIS — F3112 Bipolar disorder, current episode manic without psychotic features, moderate: Secondary | ICD-10-CM | POA: Diagnosis not present

## 2019-07-12 DIAGNOSIS — F3112 Bipolar disorder, current episode manic without psychotic features, moderate: Secondary | ICD-10-CM | POA: Diagnosis not present

## 2019-07-26 DIAGNOSIS — F3112 Bipolar disorder, current episode manic without psychotic features, moderate: Secondary | ICD-10-CM | POA: Diagnosis not present

## 2019-08-08 DIAGNOSIS — M19071 Primary osteoarthritis, right ankle and foot: Secondary | ICD-10-CM | POA: Diagnosis not present

## 2019-08-08 DIAGNOSIS — M2141 Flat foot [pes planus] (acquired), right foot: Secondary | ICD-10-CM | POA: Diagnosis not present

## 2019-08-09 DIAGNOSIS — F3112 Bipolar disorder, current episode manic without psychotic features, moderate: Secondary | ICD-10-CM | POA: Diagnosis not present

## 2019-08-23 DIAGNOSIS — F3112 Bipolar disorder, current episode manic without psychotic features, moderate: Secondary | ICD-10-CM | POA: Diagnosis not present

## 2019-08-28 DIAGNOSIS — R300 Dysuria: Secondary | ICD-10-CM | POA: Diagnosis not present

## 2019-09-06 DIAGNOSIS — F3112 Bipolar disorder, current episode manic without psychotic features, moderate: Secondary | ICD-10-CM | POA: Diagnosis not present

## 2019-09-08 DIAGNOSIS — F3342 Major depressive disorder, recurrent, in full remission: Secondary | ICD-10-CM | POA: Diagnosis not present

## 2019-09-22 DIAGNOSIS — F3112 Bipolar disorder, current episode manic without psychotic features, moderate: Secondary | ICD-10-CM | POA: Diagnosis not present

## 2019-10-04 DIAGNOSIS — F3112 Bipolar disorder, current episode manic without psychotic features, moderate: Secondary | ICD-10-CM | POA: Diagnosis not present

## 2019-10-18 DIAGNOSIS — M7542 Impingement syndrome of left shoulder: Secondary | ICD-10-CM | POA: Diagnosis not present

## 2019-10-18 DIAGNOSIS — M25512 Pain in left shoulder: Secondary | ICD-10-CM | POA: Diagnosis not present

## 2019-10-18 DIAGNOSIS — M7552 Bursitis of left shoulder: Secondary | ICD-10-CM | POA: Diagnosis not present

## 2019-10-24 DIAGNOSIS — F3112 Bipolar disorder, current episode manic without psychotic features, moderate: Secondary | ICD-10-CM | POA: Diagnosis not present

## 2019-11-21 DIAGNOSIS — F3112 Bipolar disorder, current episode manic without psychotic features, moderate: Secondary | ICD-10-CM | POA: Diagnosis not present

## 2019-12-12 DIAGNOSIS — F3112 Bipolar disorder, current episode manic without psychotic features, moderate: Secondary | ICD-10-CM | POA: Diagnosis not present

## 2019-12-26 DIAGNOSIS — F3112 Bipolar disorder, current episode manic without psychotic features, moderate: Secondary | ICD-10-CM | POA: Diagnosis not present

## 2020-01-09 DIAGNOSIS — F3112 Bipolar disorder, current episode manic without psychotic features, moderate: Secondary | ICD-10-CM | POA: Diagnosis not present

## 2020-01-16 DIAGNOSIS — M2141 Flat foot [pes planus] (acquired), right foot: Secondary | ICD-10-CM | POA: Diagnosis not present

## 2020-01-16 DIAGNOSIS — M19071 Primary osteoarthritis, right ankle and foot: Secondary | ICD-10-CM | POA: Diagnosis not present

## 2020-01-17 DIAGNOSIS — F419 Anxiety disorder, unspecified: Secondary | ICD-10-CM | POA: Diagnosis not present

## 2020-01-17 DIAGNOSIS — Z13220 Encounter for screening for lipoid disorders: Secondary | ICD-10-CM | POA: Diagnosis not present

## 2020-01-17 DIAGNOSIS — Z0000 Encounter for general adult medical examination without abnormal findings: Secondary | ICD-10-CM | POA: Diagnosis not present

## 2020-01-23 DIAGNOSIS — H93293 Other abnormal auditory perceptions, bilateral: Secondary | ICD-10-CM | POA: Diagnosis not present

## 2020-01-30 DIAGNOSIS — F3112 Bipolar disorder, current episode manic without psychotic features, moderate: Secondary | ICD-10-CM | POA: Diagnosis not present

## 2020-02-21 DIAGNOSIS — L718 Other rosacea: Secondary | ICD-10-CM | POA: Diagnosis not present

## 2020-02-28 DIAGNOSIS — M9905 Segmental and somatic dysfunction of pelvic region: Secondary | ICD-10-CM | POA: Diagnosis not present

## 2020-02-28 DIAGNOSIS — M25551 Pain in right hip: Secondary | ICD-10-CM | POA: Diagnosis not present

## 2020-02-28 DIAGNOSIS — F3112 Bipolar disorder, current episode manic without psychotic features, moderate: Secondary | ICD-10-CM | POA: Diagnosis not present

## 2020-02-28 DIAGNOSIS — S39012A Strain of muscle, fascia and tendon of lower back, initial encounter: Secondary | ICD-10-CM | POA: Diagnosis not present

## 2020-02-28 DIAGNOSIS — M9903 Segmental and somatic dysfunction of lumbar region: Secondary | ICD-10-CM | POA: Diagnosis not present

## 2020-03-02 DIAGNOSIS — M25551 Pain in right hip: Secondary | ICD-10-CM | POA: Diagnosis not present

## 2020-03-02 DIAGNOSIS — M9905 Segmental and somatic dysfunction of pelvic region: Secondary | ICD-10-CM | POA: Diagnosis not present

## 2020-03-02 DIAGNOSIS — S39012A Strain of muscle, fascia and tendon of lower back, initial encounter: Secondary | ICD-10-CM | POA: Diagnosis not present

## 2020-03-02 DIAGNOSIS — M9903 Segmental and somatic dysfunction of lumbar region: Secondary | ICD-10-CM | POA: Diagnosis not present

## 2020-03-05 DIAGNOSIS — M9905 Segmental and somatic dysfunction of pelvic region: Secondary | ICD-10-CM | POA: Diagnosis not present

## 2020-03-05 DIAGNOSIS — M9903 Segmental and somatic dysfunction of lumbar region: Secondary | ICD-10-CM | POA: Diagnosis not present

## 2020-03-05 DIAGNOSIS — M25551 Pain in right hip: Secondary | ICD-10-CM | POA: Diagnosis not present

## 2020-03-05 DIAGNOSIS — S39012A Strain of muscle, fascia and tendon of lower back, initial encounter: Secondary | ICD-10-CM | POA: Diagnosis not present

## 2020-03-08 DIAGNOSIS — G4709 Other insomnia: Secondary | ICD-10-CM | POA: Diagnosis not present

## 2020-03-08 DIAGNOSIS — F411 Generalized anxiety disorder: Secondary | ICD-10-CM | POA: Diagnosis not present

## 2020-03-08 DIAGNOSIS — F3342 Major depressive disorder, recurrent, in full remission: Secondary | ICD-10-CM | POA: Diagnosis not present

## 2020-03-09 DIAGNOSIS — M9903 Segmental and somatic dysfunction of lumbar region: Secondary | ICD-10-CM | POA: Diagnosis not present

## 2020-03-09 DIAGNOSIS — M9905 Segmental and somatic dysfunction of pelvic region: Secondary | ICD-10-CM | POA: Diagnosis not present

## 2020-03-09 DIAGNOSIS — M25551 Pain in right hip: Secondary | ICD-10-CM | POA: Diagnosis not present

## 2020-03-09 DIAGNOSIS — S39012A Strain of muscle, fascia and tendon of lower back, initial encounter: Secondary | ICD-10-CM | POA: Diagnosis not present

## 2020-03-12 DIAGNOSIS — M25551 Pain in right hip: Secondary | ICD-10-CM | POA: Diagnosis not present

## 2020-03-12 DIAGNOSIS — M9905 Segmental and somatic dysfunction of pelvic region: Secondary | ICD-10-CM | POA: Diagnosis not present

## 2020-03-12 DIAGNOSIS — S39012A Strain of muscle, fascia and tendon of lower back, initial encounter: Secondary | ICD-10-CM | POA: Diagnosis not present

## 2020-03-12 DIAGNOSIS — M9903 Segmental and somatic dysfunction of lumbar region: Secondary | ICD-10-CM | POA: Diagnosis not present

## 2020-03-16 DIAGNOSIS — S39012A Strain of muscle, fascia and tendon of lower back, initial encounter: Secondary | ICD-10-CM | POA: Diagnosis not present

## 2020-03-16 DIAGNOSIS — M25551 Pain in right hip: Secondary | ICD-10-CM | POA: Diagnosis not present

## 2020-03-16 DIAGNOSIS — M9905 Segmental and somatic dysfunction of pelvic region: Secondary | ICD-10-CM | POA: Diagnosis not present

## 2020-03-16 DIAGNOSIS — M9903 Segmental and somatic dysfunction of lumbar region: Secondary | ICD-10-CM | POA: Diagnosis not present

## 2020-03-20 DIAGNOSIS — M9905 Segmental and somatic dysfunction of pelvic region: Secondary | ICD-10-CM | POA: Diagnosis not present

## 2020-03-20 DIAGNOSIS — M25551 Pain in right hip: Secondary | ICD-10-CM | POA: Diagnosis not present

## 2020-03-20 DIAGNOSIS — S39012A Strain of muscle, fascia and tendon of lower back, initial encounter: Secondary | ICD-10-CM | POA: Diagnosis not present

## 2020-03-20 DIAGNOSIS — M9903 Segmental and somatic dysfunction of lumbar region: Secondary | ICD-10-CM | POA: Diagnosis not present

## 2020-03-27 DIAGNOSIS — M9905 Segmental and somatic dysfunction of pelvic region: Secondary | ICD-10-CM | POA: Diagnosis not present

## 2020-03-27 DIAGNOSIS — M25551 Pain in right hip: Secondary | ICD-10-CM | POA: Diagnosis not present

## 2020-03-27 DIAGNOSIS — F3112 Bipolar disorder, current episode manic without psychotic features, moderate: Secondary | ICD-10-CM | POA: Diagnosis not present

## 2020-03-27 DIAGNOSIS — M9903 Segmental and somatic dysfunction of lumbar region: Secondary | ICD-10-CM | POA: Diagnosis not present

## 2020-03-27 DIAGNOSIS — S39012A Strain of muscle, fascia and tendon of lower back, initial encounter: Secondary | ICD-10-CM | POA: Diagnosis not present

## 2020-03-30 DIAGNOSIS — S39012A Strain of muscle, fascia and tendon of lower back, initial encounter: Secondary | ICD-10-CM | POA: Diagnosis not present

## 2020-03-30 DIAGNOSIS — M9903 Segmental and somatic dysfunction of lumbar region: Secondary | ICD-10-CM | POA: Diagnosis not present

## 2020-03-30 DIAGNOSIS — M25551 Pain in right hip: Secondary | ICD-10-CM | POA: Diagnosis not present

## 2020-03-30 DIAGNOSIS — M9905 Segmental and somatic dysfunction of pelvic region: Secondary | ICD-10-CM | POA: Diagnosis not present

## 2020-04-03 DIAGNOSIS — M25551 Pain in right hip: Secondary | ICD-10-CM | POA: Diagnosis not present

## 2020-04-03 DIAGNOSIS — S39012A Strain of muscle, fascia and tendon of lower back, initial encounter: Secondary | ICD-10-CM | POA: Diagnosis not present

## 2020-04-03 DIAGNOSIS — M9903 Segmental and somatic dysfunction of lumbar region: Secondary | ICD-10-CM | POA: Diagnosis not present

## 2020-04-03 DIAGNOSIS — M9905 Segmental and somatic dysfunction of pelvic region: Secondary | ICD-10-CM | POA: Diagnosis not present

## 2020-04-10 DIAGNOSIS — M9903 Segmental and somatic dysfunction of lumbar region: Secondary | ICD-10-CM | POA: Diagnosis not present

## 2020-04-10 DIAGNOSIS — S39012A Strain of muscle, fascia and tendon of lower back, initial encounter: Secondary | ICD-10-CM | POA: Diagnosis not present

## 2020-04-10 DIAGNOSIS — M25551 Pain in right hip: Secondary | ICD-10-CM | POA: Diagnosis not present

## 2020-04-10 DIAGNOSIS — M9905 Segmental and somatic dysfunction of pelvic region: Secondary | ICD-10-CM | POA: Diagnosis not present

## 2020-04-14 DIAGNOSIS — S39012A Strain of muscle, fascia and tendon of lower back, initial encounter: Secondary | ICD-10-CM | POA: Diagnosis not present

## 2020-04-14 DIAGNOSIS — M25551 Pain in right hip: Secondary | ICD-10-CM | POA: Diagnosis not present

## 2020-04-14 DIAGNOSIS — M9905 Segmental and somatic dysfunction of pelvic region: Secondary | ICD-10-CM | POA: Diagnosis not present

## 2020-04-14 DIAGNOSIS — M9903 Segmental and somatic dysfunction of lumbar region: Secondary | ICD-10-CM | POA: Diagnosis not present

## 2020-04-17 DIAGNOSIS — M25551 Pain in right hip: Secondary | ICD-10-CM | POA: Diagnosis not present

## 2020-04-17 DIAGNOSIS — M9905 Segmental and somatic dysfunction of pelvic region: Secondary | ICD-10-CM | POA: Diagnosis not present

## 2020-04-17 DIAGNOSIS — S39012A Strain of muscle, fascia and tendon of lower back, initial encounter: Secondary | ICD-10-CM | POA: Diagnosis not present

## 2020-04-17 DIAGNOSIS — M9903 Segmental and somatic dysfunction of lumbar region: Secondary | ICD-10-CM | POA: Diagnosis not present

## 2020-04-18 DIAGNOSIS — M25571 Pain in right ankle and joints of right foot: Secondary | ICD-10-CM | POA: Diagnosis not present

## 2020-04-18 DIAGNOSIS — S82831A Other fracture of upper and lower end of right fibula, initial encounter for closed fracture: Secondary | ICD-10-CM | POA: Diagnosis not present

## 2020-05-01 DIAGNOSIS — S39012A Strain of muscle, fascia and tendon of lower back, initial encounter: Secondary | ICD-10-CM | POA: Diagnosis not present

## 2020-05-01 DIAGNOSIS — M25551 Pain in right hip: Secondary | ICD-10-CM | POA: Diagnosis not present

## 2020-05-01 DIAGNOSIS — M9903 Segmental and somatic dysfunction of lumbar region: Secondary | ICD-10-CM | POA: Diagnosis not present

## 2020-05-01 DIAGNOSIS — M9905 Segmental and somatic dysfunction of pelvic region: Secondary | ICD-10-CM | POA: Diagnosis not present

## 2020-05-02 DIAGNOSIS — S82831D Other fracture of upper and lower end of right fibula, subsequent encounter for closed fracture with routine healing: Secondary | ICD-10-CM | POA: Diagnosis not present

## 2020-05-03 DIAGNOSIS — G4709 Other insomnia: Secondary | ICD-10-CM | POA: Diagnosis not present

## 2020-05-03 DIAGNOSIS — F3342 Major depressive disorder, recurrent, in full remission: Secondary | ICD-10-CM | POA: Diagnosis not present

## 2020-05-04 DIAGNOSIS — M9903 Segmental and somatic dysfunction of lumbar region: Secondary | ICD-10-CM | POA: Diagnosis not present

## 2020-05-04 DIAGNOSIS — M9905 Segmental and somatic dysfunction of pelvic region: Secondary | ICD-10-CM | POA: Diagnosis not present

## 2020-05-04 DIAGNOSIS — M25551 Pain in right hip: Secondary | ICD-10-CM | POA: Diagnosis not present

## 2020-05-04 DIAGNOSIS — S39012A Strain of muscle, fascia and tendon of lower back, initial encounter: Secondary | ICD-10-CM | POA: Diagnosis not present

## 2020-05-15 DIAGNOSIS — M25571 Pain in right ankle and joints of right foot: Secondary | ICD-10-CM | POA: Diagnosis not present

## 2020-05-22 DIAGNOSIS — M9902 Segmental and somatic dysfunction of thoracic region: Secondary | ICD-10-CM | POA: Diagnosis not present

## 2020-05-22 DIAGNOSIS — M542 Cervicalgia: Secondary | ICD-10-CM | POA: Diagnosis not present

## 2020-05-22 DIAGNOSIS — M546 Pain in thoracic spine: Secondary | ICD-10-CM | POA: Diagnosis not present

## 2020-05-22 DIAGNOSIS — M9901 Segmental and somatic dysfunction of cervical region: Secondary | ICD-10-CM | POA: Diagnosis not present

## 2020-05-24 DIAGNOSIS — F3112 Bipolar disorder, current episode manic without psychotic features, moderate: Secondary | ICD-10-CM | POA: Diagnosis not present

## 2020-05-26 DIAGNOSIS — M9903 Segmental and somatic dysfunction of lumbar region: Secondary | ICD-10-CM | POA: Diagnosis not present

## 2020-05-26 DIAGNOSIS — S39012A Strain of muscle, fascia and tendon of lower back, initial encounter: Secondary | ICD-10-CM | POA: Diagnosis not present

## 2020-05-26 DIAGNOSIS — M25551 Pain in right hip: Secondary | ICD-10-CM | POA: Diagnosis not present

## 2020-05-26 DIAGNOSIS — M9905 Segmental and somatic dysfunction of pelvic region: Secondary | ICD-10-CM | POA: Diagnosis not present

## 2020-06-02 DIAGNOSIS — M9903 Segmental and somatic dysfunction of lumbar region: Secondary | ICD-10-CM | POA: Diagnosis not present

## 2020-06-02 DIAGNOSIS — M9905 Segmental and somatic dysfunction of pelvic region: Secondary | ICD-10-CM | POA: Diagnosis not present

## 2020-06-02 DIAGNOSIS — M25551 Pain in right hip: Secondary | ICD-10-CM | POA: Diagnosis not present

## 2020-06-02 DIAGNOSIS — S39012A Strain of muscle, fascia and tendon of lower back, initial encounter: Secondary | ICD-10-CM | POA: Diagnosis not present

## 2020-06-04 DIAGNOSIS — F331 Major depressive disorder, recurrent, moderate: Secondary | ICD-10-CM | POA: Diagnosis not present

## 2020-06-04 DIAGNOSIS — F411 Generalized anxiety disorder: Secondary | ICD-10-CM | POA: Diagnosis not present

## 2020-06-06 DIAGNOSIS — M25571 Pain in right ankle and joints of right foot: Secondary | ICD-10-CM | POA: Diagnosis not present

## 2020-06-07 DIAGNOSIS — F3112 Bipolar disorder, current episode manic without psychotic features, moderate: Secondary | ICD-10-CM | POA: Diagnosis not present

## 2020-06-12 DIAGNOSIS — M19071 Primary osteoarthritis, right ankle and foot: Secondary | ICD-10-CM | POA: Diagnosis not present

## 2020-06-12 DIAGNOSIS — M19072 Primary osteoarthritis, left ankle and foot: Secondary | ICD-10-CM | POA: Diagnosis not present

## 2020-06-12 DIAGNOSIS — M25571 Pain in right ankle and joints of right foot: Secondary | ICD-10-CM | POA: Diagnosis not present

## 2020-06-12 DIAGNOSIS — M2141 Flat foot [pes planus] (acquired), right foot: Secondary | ICD-10-CM | POA: Diagnosis not present

## 2020-06-14 DIAGNOSIS — M25571 Pain in right ankle and joints of right foot: Secondary | ICD-10-CM | POA: Diagnosis not present

## 2020-06-19 DIAGNOSIS — M25571 Pain in right ankle and joints of right foot: Secondary | ICD-10-CM | POA: Diagnosis not present

## 2020-06-20 DIAGNOSIS — F411 Generalized anxiety disorder: Secondary | ICD-10-CM | POA: Diagnosis not present

## 2020-06-25 DIAGNOSIS — M25571 Pain in right ankle and joints of right foot: Secondary | ICD-10-CM | POA: Diagnosis not present

## 2020-06-26 DIAGNOSIS — F3112 Bipolar disorder, current episode manic without psychotic features, moderate: Secondary | ICD-10-CM | POA: Diagnosis not present

## 2020-07-03 DIAGNOSIS — F411 Generalized anxiety disorder: Secondary | ICD-10-CM | POA: Diagnosis not present

## 2020-07-03 DIAGNOSIS — M25571 Pain in right ankle and joints of right foot: Secondary | ICD-10-CM | POA: Diagnosis not present

## 2020-07-04 DIAGNOSIS — S82831A Other fracture of upper and lower end of right fibula, initial encounter for closed fracture: Secondary | ICD-10-CM | POA: Diagnosis not present

## 2020-07-13 DIAGNOSIS — M9901 Segmental and somatic dysfunction of cervical region: Secondary | ICD-10-CM | POA: Diagnosis not present

## 2020-07-13 DIAGNOSIS — M546 Pain in thoracic spine: Secondary | ICD-10-CM | POA: Diagnosis not present

## 2020-07-13 DIAGNOSIS — M9902 Segmental and somatic dysfunction of thoracic region: Secondary | ICD-10-CM | POA: Diagnosis not present

## 2020-07-13 DIAGNOSIS — M542 Cervicalgia: Secondary | ICD-10-CM | POA: Diagnosis not present

## 2020-07-16 DIAGNOSIS — M9902 Segmental and somatic dysfunction of thoracic region: Secondary | ICD-10-CM | POA: Diagnosis not present

## 2020-07-16 DIAGNOSIS — M9901 Segmental and somatic dysfunction of cervical region: Secondary | ICD-10-CM | POA: Diagnosis not present

## 2020-07-16 DIAGNOSIS — M546 Pain in thoracic spine: Secondary | ICD-10-CM | POA: Diagnosis not present

## 2020-07-16 DIAGNOSIS — F3112 Bipolar disorder, current episode manic without psychotic features, moderate: Secondary | ICD-10-CM | POA: Diagnosis not present

## 2020-07-16 DIAGNOSIS — M542 Cervicalgia: Secondary | ICD-10-CM | POA: Diagnosis not present

## 2020-07-19 DIAGNOSIS — F411 Generalized anxiety disorder: Secondary | ICD-10-CM | POA: Diagnosis not present

## 2020-07-31 DIAGNOSIS — F411 Generalized anxiety disorder: Secondary | ICD-10-CM | POA: Diagnosis not present

## 2020-08-02 DIAGNOSIS — S89301A Unspecified physeal fracture of lower end of right fibula, initial encounter for closed fracture: Secondary | ICD-10-CM | POA: Diagnosis not present

## 2020-08-03 DIAGNOSIS — S89301A Unspecified physeal fracture of lower end of right fibula, initial encounter for closed fracture: Secondary | ICD-10-CM | POA: Diagnosis not present

## 2020-08-06 DIAGNOSIS — S89301A Unspecified physeal fracture of lower end of right fibula, initial encounter for closed fracture: Secondary | ICD-10-CM | POA: Diagnosis not present

## 2020-08-07 DIAGNOSIS — F411 Generalized anxiety disorder: Secondary | ICD-10-CM | POA: Diagnosis not present

## 2020-08-10 DIAGNOSIS — S89301A Unspecified physeal fracture of lower end of right fibula, initial encounter for closed fracture: Secondary | ICD-10-CM | POA: Diagnosis not present

## 2020-08-13 DIAGNOSIS — F3112 Bipolar disorder, current episode manic without psychotic features, moderate: Secondary | ICD-10-CM | POA: Diagnosis not present

## 2020-08-14 DIAGNOSIS — F411 Generalized anxiety disorder: Secondary | ICD-10-CM | POA: Diagnosis not present

## 2020-08-14 DIAGNOSIS — F330 Major depressive disorder, recurrent, mild: Secondary | ICD-10-CM | POA: Diagnosis not present

## 2020-08-14 DIAGNOSIS — S89301A Unspecified physeal fracture of lower end of right fibula, initial encounter for closed fracture: Secondary | ICD-10-CM | POA: Diagnosis not present

## 2020-08-16 DIAGNOSIS — M7541 Impingement syndrome of right shoulder: Secondary | ICD-10-CM | POA: Diagnosis not present

## 2020-08-16 DIAGNOSIS — S8261XD Displaced fracture of lateral malleolus of right fibula, subsequent encounter for closed fracture with routine healing: Secondary | ICD-10-CM | POA: Diagnosis not present

## 2020-08-17 DIAGNOSIS — S89301A Unspecified physeal fracture of lower end of right fibula, initial encounter for closed fracture: Secondary | ICD-10-CM | POA: Diagnosis not present

## 2020-08-20 DIAGNOSIS — S89301A Unspecified physeal fracture of lower end of right fibula, initial encounter for closed fracture: Secondary | ICD-10-CM | POA: Diagnosis not present

## 2020-08-23 DIAGNOSIS — S89301A Unspecified physeal fracture of lower end of right fibula, initial encounter for closed fracture: Secondary | ICD-10-CM | POA: Diagnosis not present

## 2020-08-26 IMAGING — MG DIGITAL DIAGNOSTIC UNILATERAL LEFT MAMMOGRAM WITH TOMO AND CAD
4 series · 4 of 12 positions shown · non-contrast
Comparison: Previous exam(s).

CLINICAL DATA: Possible mass in the posterior aspect of the upper
inner left breast on a recent screening mammogram.

EXAM:
DIGITAL DIAGNOSTIC LEFT MAMMOGRAM WITH TOMO
ULTRASOUND LEFT BREAST

[L CC synth-2D]
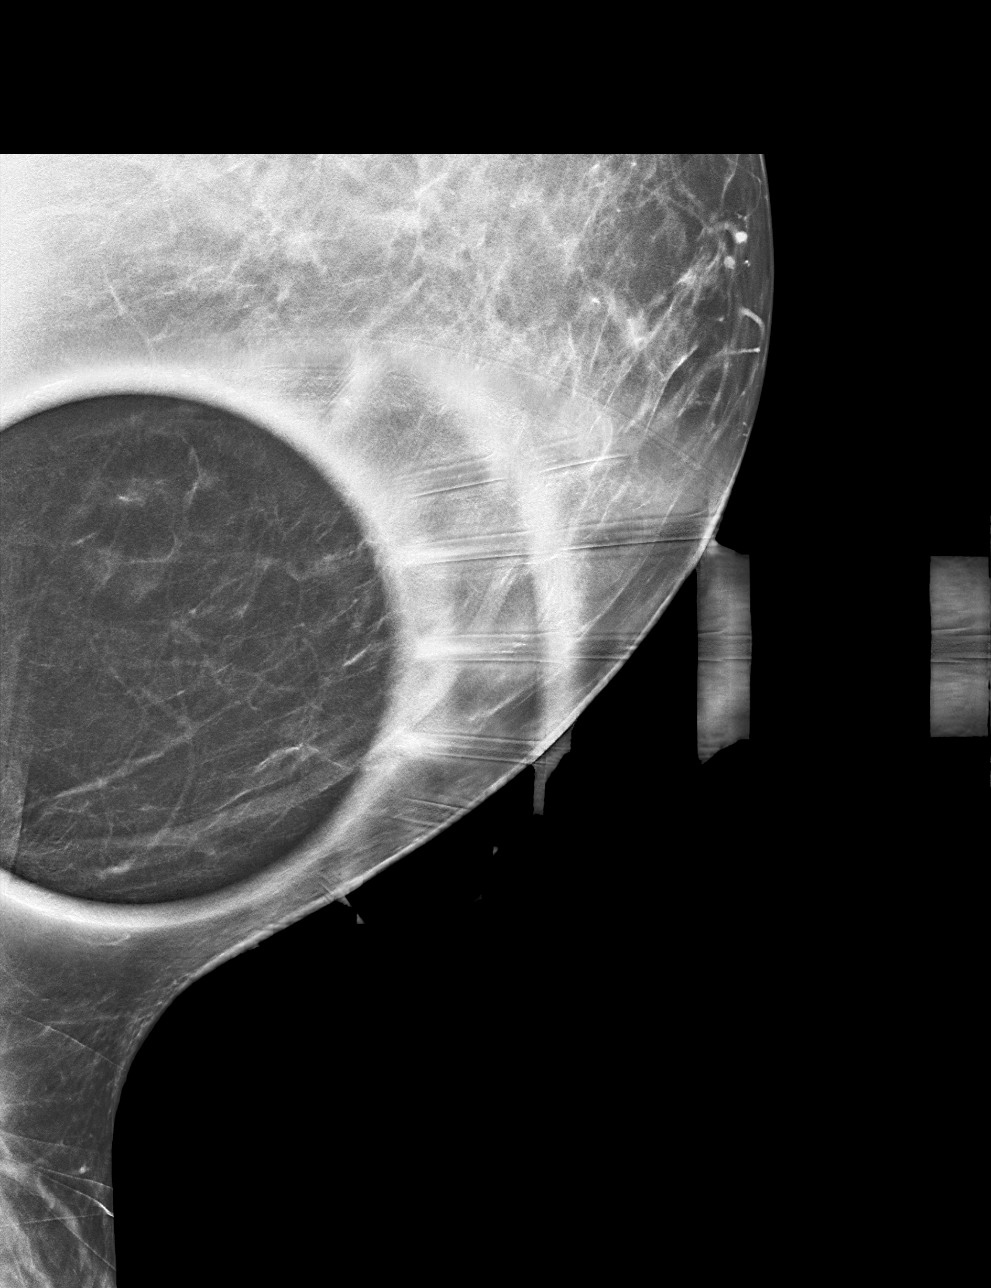

[L MLO synth-2D]
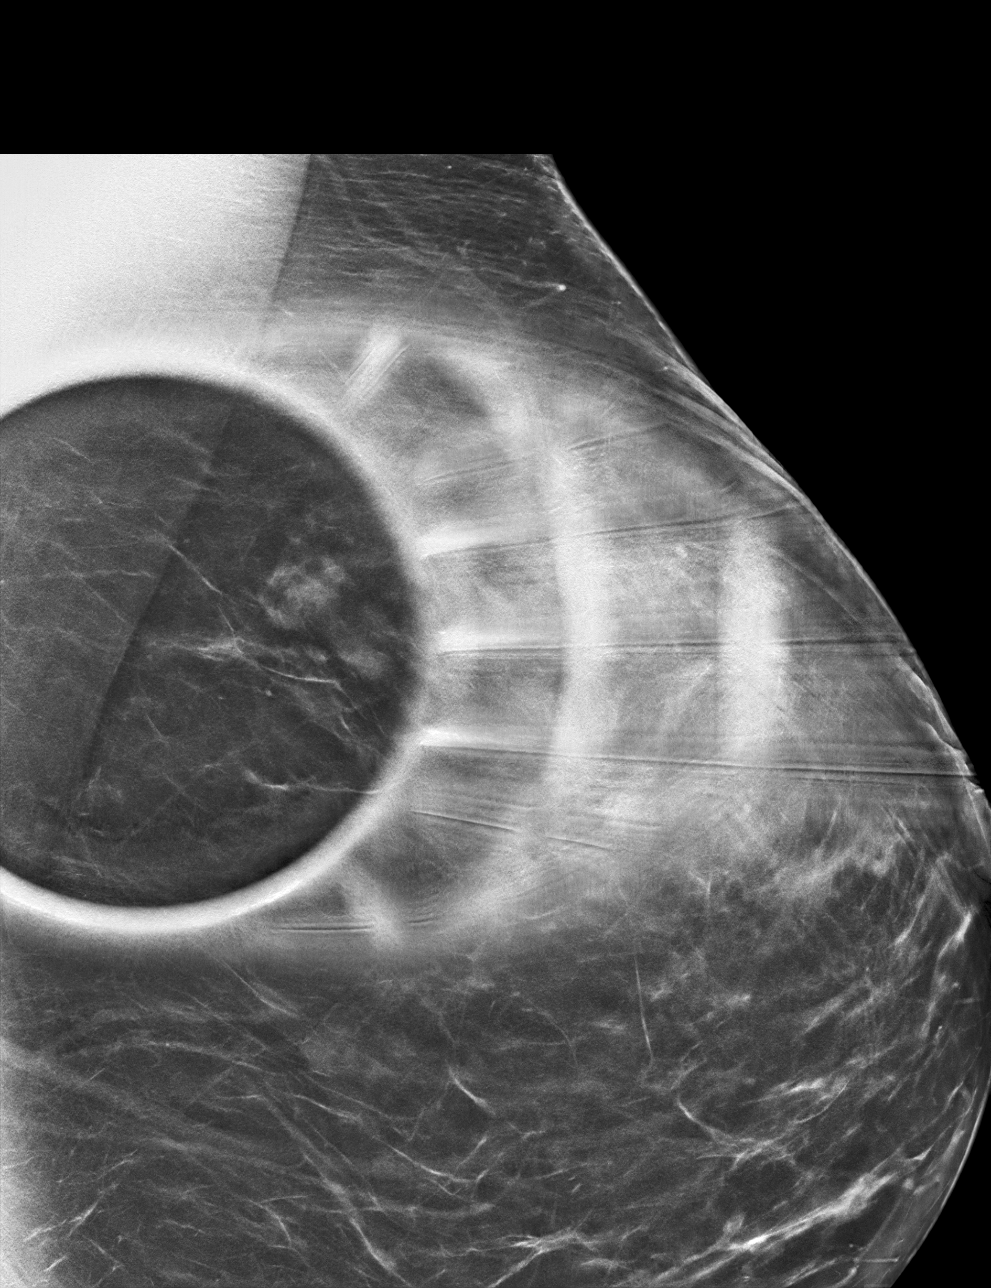

[L CC tomo · tomo slice 33/64.0]
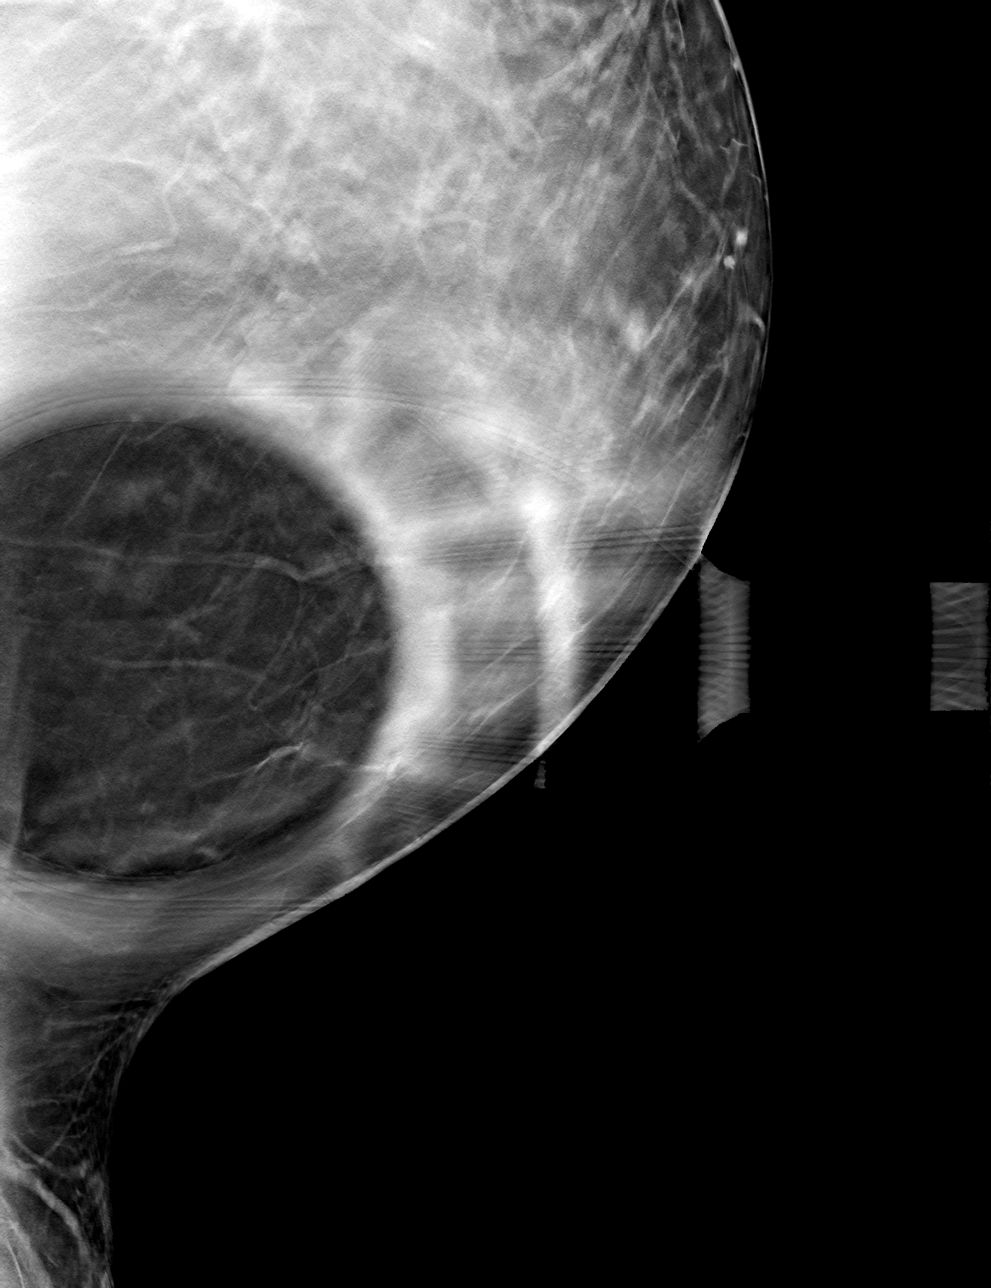

[L MLO tomo · tomo slice 44/87.0]
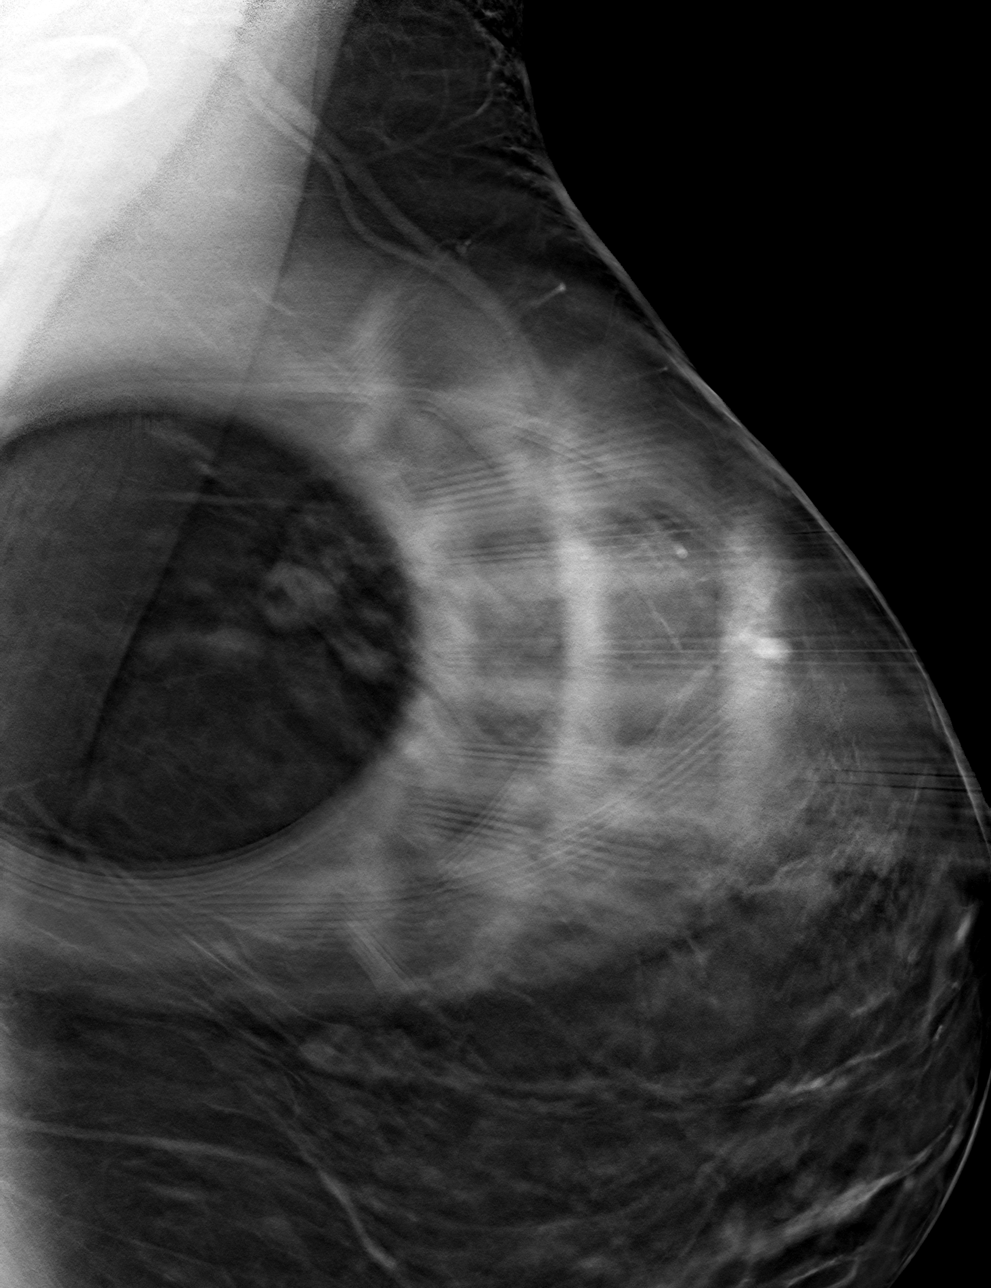

[4 of 12 positions shown; findings below may reference images not displayed]

ACR Breast Density Category b: There are scattered areas of
fibroglandular density.
FINDINGS: 3D tomographic and 2D generated spot compression views of the left
breast demonstrate normal appearing fibroglandular tissue in the
posterior aspect of the upper inner left breast at the location of
the recently suspected mass.

On physical exam, no mass or thickening is palpable in the upper
inner left breast.

Targeted ultrasound is performed, showing normal appearing breast
tissue throughout the upper inner left breast, including scattered
areas of glandular tissue, corresponding to the recently suspected
mass. No mass or other findings suspicious for malignancy were seen.
IMPRESSION: No evidence of malignancy. The recently suspected left breast mass
is normal glandular tissue.

RECOMMENDATION:
Bilateral screening mammogram in 1 year.

I have discussed the findings and recommendations with the patient.
Results were also provided in writing at the conclusion of the
visit. If applicable, a reminder letter will be sent to the patient
regarding the next appointment.

BI-RADS CATEGORY  1: Negative.

## 2020-08-26 IMAGING — US ULTRASOUND LEFT BREAST LIMITED
1 series · 4 of 4 positions shown · non-contrast
Comparison: Previous exam(s).

CLINICAL DATA: Possible mass in the posterior aspect of the upper
inner left breast on a recent screening mammogram.

EXAM:
DIGITAL DIAGNOSTIC LEFT MAMMOGRAM WITH TOMO
ULTRASOUND LEFT BREAST

[Series 1: ultrasound left breast limited · 0.09mm/px · 4 of 4 slices shown]
[im 1/4]
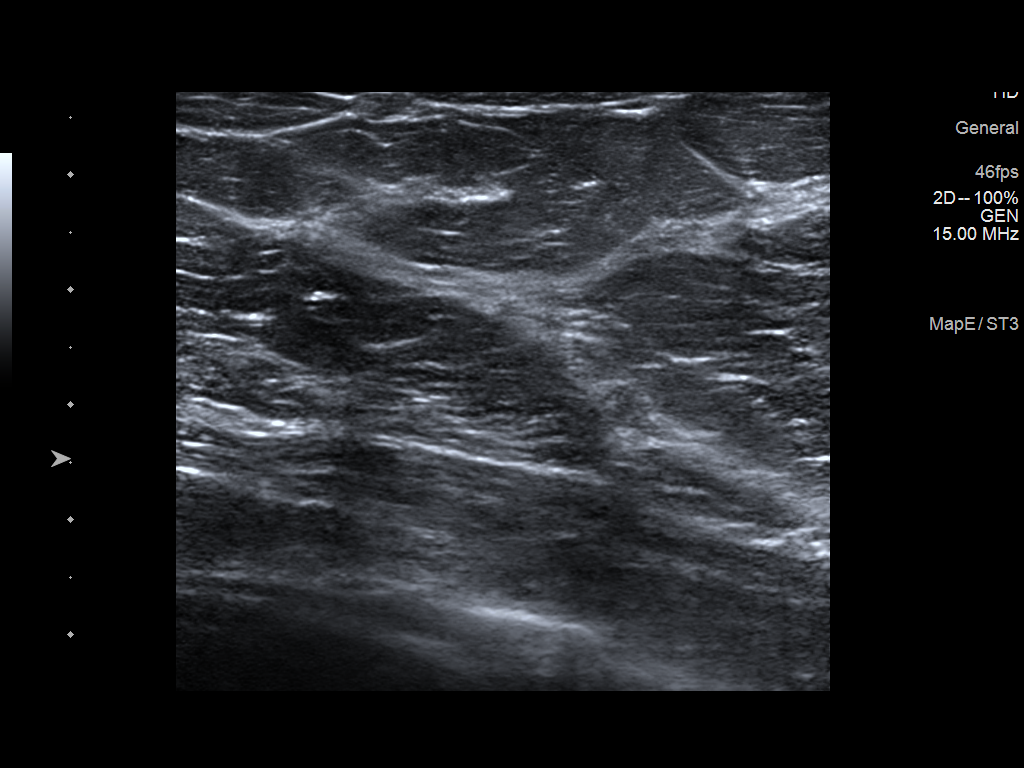
[im 2/4]
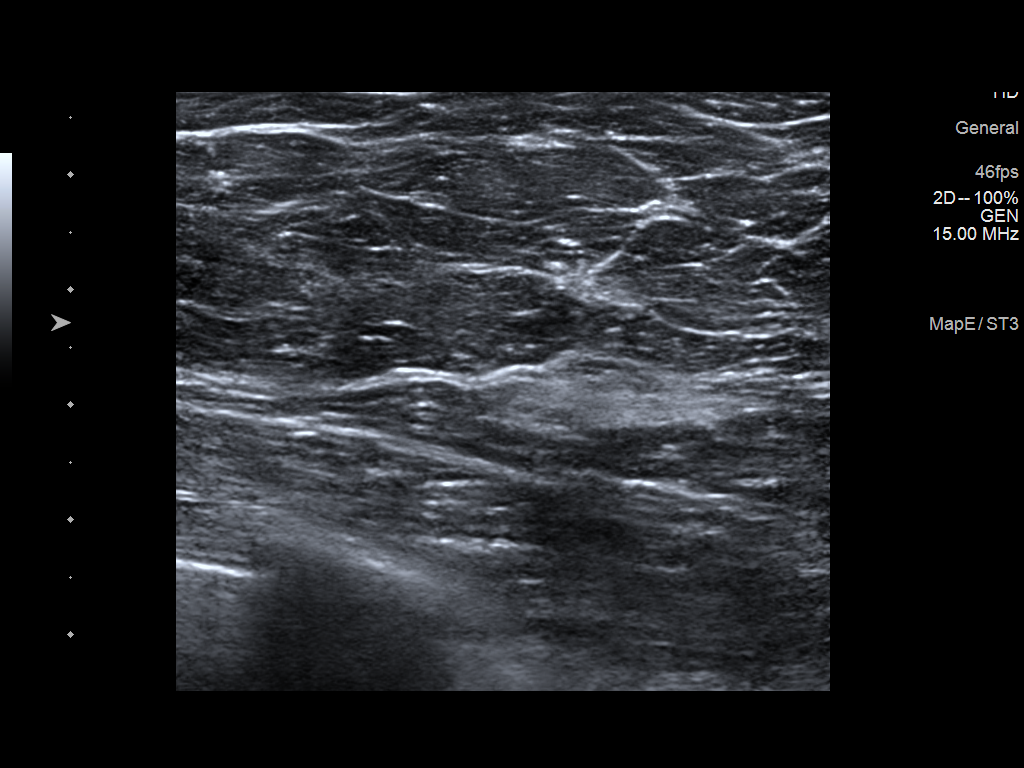
[im 3/4]
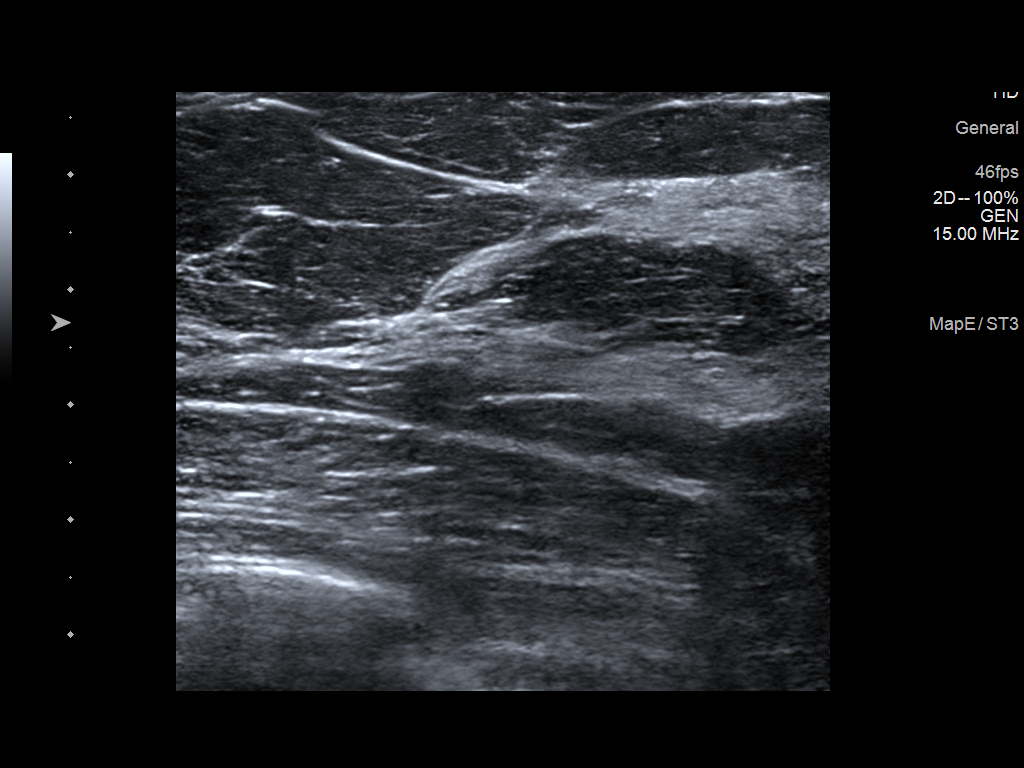
[im 4/4]
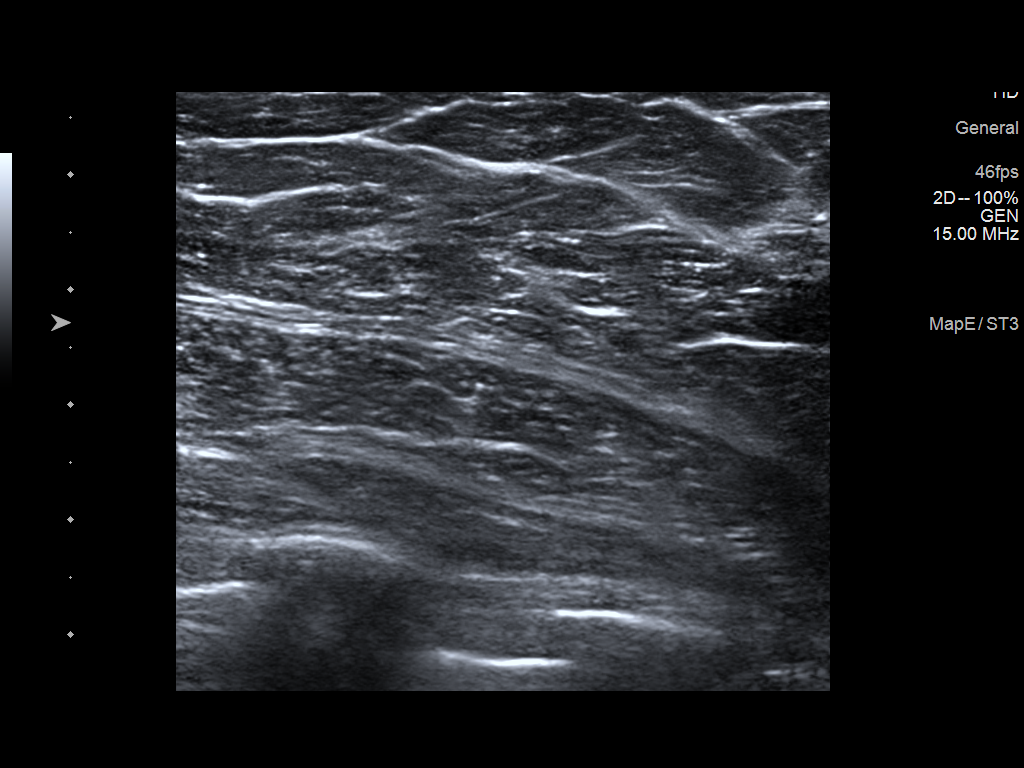

[4 of 4 positions shown; findings below may reference images not displayed]

ACR Breast Density Category b: There are scattered areas of
fibroglandular density.
FINDINGS: 3D tomographic and 2D generated spot compression views of the left
breast demonstrate normal appearing fibroglandular tissue in the
posterior aspect of the upper inner left breast at the location of
the recently suspected mass.

On physical exam, no mass or thickening is palpable in the upper
inner left breast.

Targeted ultrasound is performed, showing normal appearing breast
tissue throughout the upper inner left breast, including scattered
areas of glandular tissue, corresponding to the recently suspected
mass. No mass or other findings suspicious for malignancy were seen.
IMPRESSION: No evidence of malignancy. The recently suspected left breast mass
is normal glandular tissue.

RECOMMENDATION:
Bilateral screening mammogram in 1 year.

I have discussed the findings and recommendations with the patient.
Results were also provided in writing at the conclusion of the
visit. If applicable, a reminder letter will be sent to the patient
regarding the next appointment.

BI-RADS CATEGORY  1: Negative.

## 2020-08-28 DIAGNOSIS — F3112 Bipolar disorder, current episode manic without psychotic features, moderate: Secondary | ICD-10-CM | POA: Diagnosis not present

## 2020-09-04 DIAGNOSIS — S89301A Unspecified physeal fracture of lower end of right fibula, initial encounter for closed fracture: Secondary | ICD-10-CM | POA: Diagnosis not present

## 2020-09-13 DIAGNOSIS — F330 Major depressive disorder, recurrent, mild: Secondary | ICD-10-CM | POA: Diagnosis not present

## 2020-09-13 DIAGNOSIS — S89301A Unspecified physeal fracture of lower end of right fibula, initial encounter for closed fracture: Secondary | ICD-10-CM | POA: Diagnosis not present

## 2020-09-13 DIAGNOSIS — F411 Generalized anxiety disorder: Secondary | ICD-10-CM | POA: Diagnosis not present

## 2020-09-19 DIAGNOSIS — M79671 Pain in right foot: Secondary | ICD-10-CM | POA: Diagnosis not present

## 2020-09-24 DIAGNOSIS — Z1231 Encounter for screening mammogram for malignant neoplasm of breast: Secondary | ICD-10-CM | POA: Diagnosis not present

## 2020-09-24 DIAGNOSIS — Z13820 Encounter for screening for osteoporosis: Secondary | ICD-10-CM | POA: Diagnosis not present

## 2020-09-24 DIAGNOSIS — Z6839 Body mass index (BMI) 39.0-39.9, adult: Secondary | ICD-10-CM | POA: Diagnosis not present

## 2020-09-24 DIAGNOSIS — Z01419 Encounter for gynecological examination (general) (routine) without abnormal findings: Secondary | ICD-10-CM | POA: Diagnosis not present

## 2020-09-27 DIAGNOSIS — R350 Frequency of micturition: Secondary | ICD-10-CM | POA: Diagnosis not present

## 2020-10-01 DIAGNOSIS — F411 Generalized anxiety disorder: Secondary | ICD-10-CM | POA: Diagnosis not present

## 2020-10-19 DIAGNOSIS — F411 Generalized anxiety disorder: Secondary | ICD-10-CM | POA: Diagnosis not present

## 2020-10-23 DIAGNOSIS — F3342 Major depressive disorder, recurrent, in full remission: Secondary | ICD-10-CM | POA: Diagnosis not present

## 2020-10-23 DIAGNOSIS — G4700 Insomnia, unspecified: Secondary | ICD-10-CM | POA: Diagnosis not present

## 2020-11-07 DIAGNOSIS — I10 Essential (primary) hypertension: Secondary | ICD-10-CM | POA: Diagnosis not present

## 2020-11-07 DIAGNOSIS — M62838 Other muscle spasm: Secondary | ICD-10-CM | POA: Diagnosis not present

## 2020-11-12 DIAGNOSIS — F411 Generalized anxiety disorder: Secondary | ICD-10-CM | POA: Diagnosis not present

## 2020-11-16 DIAGNOSIS — F4322 Adjustment disorder with anxiety: Secondary | ICD-10-CM | POA: Diagnosis not present

## 2020-11-16 DIAGNOSIS — F3342 Major depressive disorder, recurrent, in full remission: Secondary | ICD-10-CM | POA: Diagnosis not present

## 2020-11-16 DIAGNOSIS — F4321 Adjustment disorder with depressed mood: Secondary | ICD-10-CM | POA: Diagnosis not present

## 2020-11-22 DIAGNOSIS — F330 Major depressive disorder, recurrent, mild: Secondary | ICD-10-CM | POA: Diagnosis not present

## 2020-11-22 DIAGNOSIS — F411 Generalized anxiety disorder: Secondary | ICD-10-CM | POA: Diagnosis not present

## 2020-11-24 DIAGNOSIS — M9902 Segmental and somatic dysfunction of thoracic region: Secondary | ICD-10-CM | POA: Diagnosis not present

## 2020-11-24 DIAGNOSIS — M542 Cervicalgia: Secondary | ICD-10-CM | POA: Diagnosis not present

## 2020-11-24 DIAGNOSIS — M9901 Segmental and somatic dysfunction of cervical region: Secondary | ICD-10-CM | POA: Diagnosis not present

## 2020-11-24 DIAGNOSIS — M546 Pain in thoracic spine: Secondary | ICD-10-CM | POA: Diagnosis not present

## 2020-11-26 DIAGNOSIS — F411 Generalized anxiety disorder: Secondary | ICD-10-CM | POA: Diagnosis not present

## 2020-11-27 DIAGNOSIS — M546 Pain in thoracic spine: Secondary | ICD-10-CM | POA: Diagnosis not present

## 2020-11-27 DIAGNOSIS — M9901 Segmental and somatic dysfunction of cervical region: Secondary | ICD-10-CM | POA: Diagnosis not present

## 2020-11-27 DIAGNOSIS — M9902 Segmental and somatic dysfunction of thoracic region: Secondary | ICD-10-CM | POA: Diagnosis not present

## 2020-11-27 DIAGNOSIS — M542 Cervicalgia: Secondary | ICD-10-CM | POA: Diagnosis not present

## 2020-12-04 DIAGNOSIS — M542 Cervicalgia: Secondary | ICD-10-CM | POA: Diagnosis not present

## 2020-12-04 DIAGNOSIS — M9901 Segmental and somatic dysfunction of cervical region: Secondary | ICD-10-CM | POA: Diagnosis not present

## 2020-12-04 DIAGNOSIS — M9902 Segmental and somatic dysfunction of thoracic region: Secondary | ICD-10-CM | POA: Diagnosis not present

## 2020-12-04 DIAGNOSIS — M546 Pain in thoracic spine: Secondary | ICD-10-CM | POA: Diagnosis not present

## 2020-12-11 DIAGNOSIS — M7551 Bursitis of right shoulder: Secondary | ICD-10-CM | POA: Diagnosis not present

## 2020-12-13 DIAGNOSIS — F411 Generalized anxiety disorder: Secondary | ICD-10-CM | POA: Diagnosis not present

## 2020-12-17 DIAGNOSIS — M7551 Bursitis of right shoulder: Secondary | ICD-10-CM | POA: Diagnosis not present

## 2020-12-20 DIAGNOSIS — M7551 Bursitis of right shoulder: Secondary | ICD-10-CM | POA: Diagnosis not present

## 2020-12-21 DIAGNOSIS — F411 Generalized anxiety disorder: Secondary | ICD-10-CM | POA: Diagnosis not present

## 2020-12-21 DIAGNOSIS — F330 Major depressive disorder, recurrent, mild: Secondary | ICD-10-CM | POA: Diagnosis not present

## 2020-12-25 DIAGNOSIS — M7551 Bursitis of right shoulder: Secondary | ICD-10-CM | POA: Diagnosis not present

## 2020-12-27 DIAGNOSIS — M7551 Bursitis of right shoulder: Secondary | ICD-10-CM | POA: Diagnosis not present

## 2020-12-31 DIAGNOSIS — M7551 Bursitis of right shoulder: Secondary | ICD-10-CM | POA: Diagnosis not present

## 2021-01-01 DIAGNOSIS — F330 Major depressive disorder, recurrent, mild: Secondary | ICD-10-CM | POA: Diagnosis not present

## 2021-01-01 DIAGNOSIS — F411 Generalized anxiety disorder: Secondary | ICD-10-CM | POA: Diagnosis not present

## 2021-01-05 DIAGNOSIS — M7551 Bursitis of right shoulder: Secondary | ICD-10-CM | POA: Diagnosis not present

## 2021-01-07 DIAGNOSIS — M7551 Bursitis of right shoulder: Secondary | ICD-10-CM | POA: Diagnosis not present

## 2021-01-11 DIAGNOSIS — M7551 Bursitis of right shoulder: Secondary | ICD-10-CM | POA: Diagnosis not present

## 2021-02-13 DIAGNOSIS — R350 Frequency of micturition: Secondary | ICD-10-CM | POA: Diagnosis not present

## 2021-02-13 DIAGNOSIS — R3129 Other microscopic hematuria: Secondary | ICD-10-CM | POA: Diagnosis not present

## 2021-02-13 DIAGNOSIS — R300 Dysuria: Secondary | ICD-10-CM | POA: Diagnosis not present

## 2021-02-22 DIAGNOSIS — M2141 Flat foot [pes planus] (acquired), right foot: Secondary | ICD-10-CM | POA: Diagnosis not present

## 2021-02-22 DIAGNOSIS — M19071 Primary osteoarthritis, right ankle and foot: Secondary | ICD-10-CM | POA: Diagnosis not present

## 2021-02-22 DIAGNOSIS — M19072 Primary osteoarthritis, left ankle and foot: Secondary | ICD-10-CM | POA: Diagnosis not present

## 2021-02-27 DIAGNOSIS — Z20822 Contact with and (suspected) exposure to covid-19: Secondary | ICD-10-CM | POA: Diagnosis not present

## 2021-03-15 DIAGNOSIS — F411 Generalized anxiety disorder: Secondary | ICD-10-CM | POA: Diagnosis not present

## 2021-04-04 DIAGNOSIS — F411 Generalized anxiety disorder: Secondary | ICD-10-CM | POA: Diagnosis not present

## 2021-04-16 DIAGNOSIS — G4700 Insomnia, unspecified: Secondary | ICD-10-CM | POA: Diagnosis not present

## 2021-04-16 DIAGNOSIS — R300 Dysuria: Secondary | ICD-10-CM | POA: Diagnosis not present

## 2021-04-16 DIAGNOSIS — F411 Generalized anxiety disorder: Secondary | ICD-10-CM | POA: Diagnosis not present

## 2021-04-16 DIAGNOSIS — F3342 Major depressive disorder, recurrent, in full remission: Secondary | ICD-10-CM | POA: Diagnosis not present

## 2021-04-25 DIAGNOSIS — F411 Generalized anxiety disorder: Secondary | ICD-10-CM | POA: Diagnosis not present

## 2021-05-09 DIAGNOSIS — F411 Generalized anxiety disorder: Secondary | ICD-10-CM | POA: Diagnosis not present

## 2021-05-14 DIAGNOSIS — R310 Gross hematuria: Secondary | ICD-10-CM | POA: Diagnosis not present

## 2021-05-14 DIAGNOSIS — F411 Generalized anxiety disorder: Secondary | ICD-10-CM | POA: Diagnosis not present

## 2021-05-21 DIAGNOSIS — M546 Pain in thoracic spine: Secondary | ICD-10-CM | POA: Diagnosis not present

## 2021-05-21 DIAGNOSIS — M542 Cervicalgia: Secondary | ICD-10-CM | POA: Diagnosis not present

## 2021-05-21 DIAGNOSIS — M9902 Segmental and somatic dysfunction of thoracic region: Secondary | ICD-10-CM | POA: Diagnosis not present

## 2021-05-21 DIAGNOSIS — M9901 Segmental and somatic dysfunction of cervical region: Secondary | ICD-10-CM | POA: Diagnosis not present

## 2021-05-21 DIAGNOSIS — M9907 Segmental and somatic dysfunction of upper extremity: Secondary | ICD-10-CM | POA: Diagnosis not present

## 2021-05-23 DIAGNOSIS — N2889 Other specified disorders of kidney and ureter: Secondary | ICD-10-CM | POA: Diagnosis not present

## 2021-05-23 DIAGNOSIS — R310 Gross hematuria: Secondary | ICD-10-CM | POA: Diagnosis not present

## 2021-05-24 DIAGNOSIS — M9902 Segmental and somatic dysfunction of thoracic region: Secondary | ICD-10-CM | POA: Diagnosis not present

## 2021-05-24 DIAGNOSIS — M9901 Segmental and somatic dysfunction of cervical region: Secondary | ICD-10-CM | POA: Diagnosis not present

## 2021-05-24 DIAGNOSIS — M546 Pain in thoracic spine: Secondary | ICD-10-CM | POA: Diagnosis not present

## 2021-05-24 DIAGNOSIS — M542 Cervicalgia: Secondary | ICD-10-CM | POA: Diagnosis not present

## 2021-05-27 ENCOUNTER — Other Ambulatory Visit: Payer: Self-pay | Admitting: Urology

## 2021-05-28 DIAGNOSIS — M546 Pain in thoracic spine: Secondary | ICD-10-CM | POA: Diagnosis not present

## 2021-05-28 DIAGNOSIS — M542 Cervicalgia: Secondary | ICD-10-CM | POA: Diagnosis not present

## 2021-05-28 DIAGNOSIS — M9902 Segmental and somatic dysfunction of thoracic region: Secondary | ICD-10-CM | POA: Diagnosis not present

## 2021-05-28 DIAGNOSIS — M9901 Segmental and somatic dysfunction of cervical region: Secondary | ICD-10-CM | POA: Diagnosis not present

## 2021-05-29 NOTE — Patient Instructions (Addendum)
DUE TO COVID-19 ONLY ONE VISITOR IS ALLOWED TO COME WITH YOU AND STAY IN THE WAITING ROOM ONLY DURING PRE OP AND PROCEDURE.   **NO VISITORS ARE ALLOWED IN THE SHORT STAY AREA OR RECOVERY ROOM!!**  IF YOU WILL BE ADMITTED INTO THE HOSPITAL YOU ARE ALLOWED ONLY TWO SUPPORT PEOPLE DURING VISITATION HOURS ONLY (10AM -8PM)   The support person(s) may change daily. The support person(s) must pass our screening, gel in and out, and wear a mask at all times, including in the patient's room. Patients must also wear a mask when staff or their support person are in the room.  No visitors under the age of 61. Any visitor under the age of 84 must be accompanied by an adult.    COVID SWAB TESTING MUST BE COMPLETED ON:  06/03/21 @ 9:50 AM   [address] [place], [place] [address]   You are not required to quarantine, however you are required to wear a well-fitted mask when you are out and around people not in your household.  Hand Hygiene often Do NOT share personal items Notify your provider if you are in close contact with someone who has COVID or you develop fever 100.4 or greater, new onset of sneezing, cough, sore throat, shortness of breath or body aches.       Your procedure is scheduled on: 06/05/21   Report to [facility] Main  Entrance    Report to admitting at 6:30 AM   Call this number if you have problems the morning of surgery [phone]   Do not eat food :After Midnight.   May have liquids until 5:30 AM day of surgery  CLEAR LIQUID DIET  Foods Allowed                                                                     Foods Excluded  Water, Black Coffee and tea, regular and decaf                liquids that you cannot  Plain Jell-O in any flavor  (No red)                                      see through such as: Fruit ices (not with fruit pulp)                                             milk, soups, orange juice              Iced Popsicles (No red)                                                  All solid food                                   Apple  juices Sports drinks like Gatorade (No red) Lightly seasoned clear broth or consume(fat free) Sugar, honey syrup  Oral Hygiene is also important to reduce your risk of infection.                                    Remember - BRUSH YOUR TEETH THE MORNING OF SURGERY WITH YOUR REGULAR TOOTHPASTE   Take these medicines the morning of surgery with A SIP OF WATER: Alprazolam, Cetirizine, Duloxetine, Vilazodone.                               You may not have any metal on your body including hair pins, jewelry, and body piercing             Do not wear make-up, lotions, powders, perfumes, or deodorant  Do not wear nail polish including gel and S&S, artificial/acrylic nails, or any other type of covering on natural nails including finger and toenails. If you have artificial nails, gel coating, etc. that needs to be removed by a nail salon please have this removed prior to surgery or surgery may need to be canceled/ delayed if the surgeon/ anesthesia feels like they are unable to be safely monitored.   Do not shave  48 hours prior to surgery.    Do not bring valuables to the hospital. [place].   Contacts, dentures or bridgework may not be worn into surgery.   Bring small overnight bag day of surgery.  Special Instructions: Bring a copy of your healthcare power of attorney and living will documents         the day of surgery if you haven't scanned them in before.  Please read over the following fact sheets you were given: IF YOU HAVE QUESTIONS ABOUT YOUR PRE OP INSTRUCTIONS PLEASE CALL [phone]   [place] - Preparing for Surgery Before surgery, you can play an important role.  Because skin is not sterile, your skin needs to be as free of germs as possible.  You can reduce the number of germs on your skin by washing with CHG (chlorahexidine gluconate) soap  before surgery.  CHG is an antiseptic cleaner which kills germs and bonds with the skin to continue killing germs even after washing. Please DO NOT use if you have an allergy to CHG or antibacterial soaps.  If your skin becomes reddened/irritated stop using the CHG and inform your nurse when you arrive at Short Stay. Do not shave (including legs and underarms) for at least 48 hours prior to the first CHG shower.  You may shave your face/neck.  Please follow these instructions carefully:  1.  Shower with CHG Soap the night before surgery and the  morning of surgery.  2.  If you choose to wash your hair, wash your hair first as usual with your normal  shampoo.  3.  After you shampoo, rinse your hair and body thoroughly to remove the shampoo.                             4.  Use CHG as you would any other liquid soap.  You can apply chg directly to the skin and wash.  Gently with a  scrungie or clean washcloth.  5.  Apply the CHG Soap to your body ONLY FROM THE NECK DOWN.   Do   not use on face/ open                           Wound or open sores. Avoid contact with eyes, ears mouth and   genitals (private parts).                       Wash face,  Genitals (private parts) with your normal soap.             6.  Wash thoroughly, paying special attention to the area where your    surgery  will be performed.  7.  Thoroughly rinse your body with warm water from the neck down.  8.  DO NOT shower/wash with your normal soap after using and rinsing off the CHG Soap.                9.  Pat yourself dry with a clean towel.            10.  Wear clean pajamas.            11.  Place clean sheets on your bed the night of your first shower and do not  sleep with pets. Day of Surgery : Do not apply any lotions/deodorants the morning of surgery.  Please wear clean clothes to the hospital/surgery center.  FAILURE TO FOLLOW THESE INSTRUCTIONS MAY RESULT IN THE CANCELLATION OF YOUR SURGERY  PATIENT  SIGNATURE_________________________________  NURSE SIGNATURE__________________________________  ________________________________________________________________________  WHAT IS A BLOOD TRANSFUSION? Blood Transfusion Information  A transfusion is the replacement of blood or some of its parts. Blood is made up of multiple cells which provide different functions. Red blood cells carry oxygen and are used for blood loss replacement. White blood cells fight against infection. Platelets control bleeding. Plasma helps clot blood. Other blood products are available for specialized needs, such as hemophilia or other clotting disorders. BEFORE THE TRANSFUSION  Who gives blood for transfusions?  Healthy volunteers who are fully evaluated to make sure their blood is safe. This is blood bank blood. Transfusion therapy is the safest it has ever been in the practice of medicine. Before blood is taken from a donor, a complete history is taken to make sure that person has no history of diseases nor engages in risky social behavior (examples are intravenous drug use or sexual activity with multiple partners). The donor's travel history is screened to minimize risk of transmitting infections, such as malaria. The donated blood is tested for signs of infectious diseases, such as HIV and hepatitis. The blood is then tested to be sure it is compatible with you in order to minimize the chance of a transfusion reaction. If you or a relative donates blood, this is often done in anticipation of surgery and is not appropriate for emergency situations. It takes many days to process the donated blood. RISKS AND COMPLICATIONS Although transfusion therapy is very safe and saves many lives, the main dangers of transfusion include:  Getting an infectious disease. Developing a transfusion reaction. This is an allergic reaction to something in the blood you were given. Every precaution is taken to prevent this. The decision to  have a blood transfusion has been considered carefully by your caregiver before blood is given. Blood is not given unless the benefits outweigh the risks. AFTER  THE TRANSFUSION Right after receiving a blood transfusion, you will usually feel much better and more energetic. This is especially true if your red blood cells have gotten low (anemic). The transfusion raises the level of the red blood cells which carry oxygen, and this usually causes an energy increase. The nurse administering the transfusion will monitor you carefully for complications. HOME CARE INSTRUCTIONS  No special instructions are needed after a transfusion. You may find your energy is better. Speak with your caregiver about any limitations on activity for underlying diseases you may have. SEEK MEDICAL CARE IF:  Your condition is not improving after your transfusion. You develop redness or irritation at the intravenous (IV) site. SEEK IMMEDIATE MEDICAL CARE IF:  Any of the following symptoms occur over the next 12 hours: Shaking chills. You have a temperature by mouth above 102 F (38.9 C), not controlled by medicine. Chest, back, or muscle pain. People around you feel you are not acting correctly or are confused. Shortness of breath or difficulty breathing. Dizziness and fainting. You get a rash or develop hives. You have a decrease in urine output. Your urine turns a dark color or changes to pink, red, or brown. Any of the following symptoms occur over the next 10 days: You have a temperature by mouth above 102 F (38.9 C), not controlled by medicine. Shortness of breath. Weakness after normal activity. The white part of the eye turns yellow (jaundice). You have a decrease in the amount of urine or are urinating less often. Your urine turns a dark color or changes to pink, red, or brown. Document Released: 10/31/2000 Document Revised: 01/26/2012 Document Reviewed: 06/19/2008 [facility] Patient Information 2014  [place], [place].  _______________________________________________________________________

## 2021-05-29 NOTE — Progress Notes (Addendum)
COVID Vaccine Completed:yes x3 Date COVID Vaccine completed: Has received booster: COVID vaccine manufacturer: [employer]   Date of COVID positive in last 90 days: N/A  PCP - Synthia Innocent Cardiologist - N/A  Chest x-ray - N/A EKG - N/A Stress Test - N/a ECHO - N/A Cardiac Cath - N/a Pacemaker/ICD device last checked:N/A Spinal Cord Stimulator: N/A  Sleep Study - N/A CPAP -   Fasting Blood Sugar - N/A Checks Blood Sugar _____ times a day  Blood Thinner Instructions: N/a Aspirin Instructions: Last Dose:  Activity level:  Can go up a flight of stairs and perform activities of daily living without stopping and without symptoms of chest pain or shortness of breath.      Anesthesia review:   Patient denies shortness of breath, fever, cough and chest pain at PAT appointment   Patient verbalized understanding of instructions that were given to them at the PAT appointment. Patient was also instructed that they will need to review over the PAT instructions again at home before surgery.

## 2021-05-30 DIAGNOSIS — C649 Malignant neoplasm of unspecified kidney, except renal pelvis: Secondary | ICD-10-CM | POA: Diagnosis not present

## 2021-05-30 DIAGNOSIS — R911 Solitary pulmonary nodule: Secondary | ICD-10-CM | POA: Diagnosis not present

## 2021-05-30 DIAGNOSIS — D49511 Neoplasm of unspecified behavior of right kidney: Secondary | ICD-10-CM | POA: Diagnosis not present

## 2021-05-31 DIAGNOSIS — F411 Generalized anxiety disorder: Secondary | ICD-10-CM | POA: Diagnosis not present

## 2021-06-02 ENCOUNTER — Other Ambulatory Visit: Payer: Self-pay

## 2021-06-02 ENCOUNTER — Encounter: Payer: Self-pay | Admitting: Emergency Medicine

## 2021-06-02 ENCOUNTER — Emergency Department
Admission: EM | Admit: 2021-06-02 | Discharge: 2021-06-02 | Disposition: A | Discharge status: Home / Self Care | Payer: BC Managed Care – PPO | Source: Home / Self Care

## 2021-06-02 DIAGNOSIS — R300 Dysuria: Secondary | ICD-10-CM

## 2021-06-02 DIAGNOSIS — R319 Hematuria, unspecified: Secondary | ICD-10-CM

## 2021-06-02 LAB — POCT URINALYSIS DIP (MANUAL ENTRY)
Bilirubin, UA: NEGATIVE
Glucose, UA: NEGATIVE mg/dL
Ketones, POC UA: NEGATIVE mg/dL
Leukocytes, UA: NEGATIVE
Nitrite, UA: NEGATIVE
Protein Ur, POC: NEGATIVE mg/dL
Spec Grav, UA: 1.02 (ref 1.010–1.025)
Urobilinogen, UA: 0.2 E.U./dL
pH, UA: 7 (ref 5.0–8.0)

## 2021-06-02 NOTE — ED Provider Notes (Signed)
[facility]    CSN: [id number] Arrival date & time: 06/02/21  1323      History   Chief Complaint Chief Complaint  Patient presents with   Dysuria    HPI Angelica Morgan is a 55 y.o. female.   HPI 55 year old female presents with dysuria, urinary frequency, and hematic area for 1 day.  Additionally, reports decreased urine output.  Patient reports that she is scheduled to have kidney surgery on Wednesday, 06/05/2021 due to malignant tumor of right kidney. Patient requests records be sent to [facility].  Past Medical History:  Diagnosis Date   Anxiety    Depression    Edema    legs and feet   PONV (postoperative nausea and vomiting)    Seasonal allergies    Sleep apnea    SVD (spontaneous vaginal delivery)    x 2    Patient Active Problem List   Diagnosis Date Noted   MDD (major depressive disorder), single episode, severe , no psychosis ([place]) 02/03/2019    Past Surgical History:  Procedure Laterality Date   BREAST BIOPSY Right    CESAREAN SECTION     x 1   LAPAROSCOPIC ASSISTED VAGINAL HYSTERECTOMY N/A 04/13/2013   Procedure: LAPAROSCOPIC ASSISTED VAGINAL HYSTERECTOMY;  Surgeon: Marylynn Pearson, MD;  Location: [facility];  Service: Gynecology;  Laterality: N/A;   NOVASURE ABLATION     WISDOM TOOTH EXTRACTION      OB History   No obstetric history on file.      Home Medications    Prior to Admission medications   Medication Sig Start Date End Date Taking? Authorizing Provider  ALPRAZolam Duanne Moron) 0.5 MG tablet Take 0.5 mg by mouth in the morning and at bedtime. 03/08/21  Yes [provider]  azelastine (OPTIVAR) 0.05 % ophthalmic solution Place 1 drop into both eyes daily as needed for allergies. 03/31/21  Yes [provider]  bimatoprost (LATISSE) 0.03 % ophthalmic solution Place 1 application into both eyes at bedtime. 05/17/21  Yes [provider]  cetirizine (ZYRTEC) 10 MG tablet Take 10 mg by mouth daily.   Yes  [provider]  CRANBERRY PO Take 1 tablet by mouth daily.   Yes [provider]  DULoxetine (CYMBALTA) 60 MG capsule Take 1 capsule (60 mg total) by mouth 2 (two) times daily. Patient taking differently: Take 60 mg by mouth in the morning. 02/05/19  Yes Starkes-Perry, Gayland Curry, FNP  estradiol (ESTRACE) 0.1 MG/GM vaginal cream Place 1 Applicatorful vaginally daily as needed (irritation). 05/11/21  Yes [provider]  fluticasone (FLONASE) 50 MCG/ACT nasal spray Place 1 spray into both nostrils daily as needed for allergies.   Yes [provider]  gabapentin (NEURONTIN) 300 MG capsule Take 900 mg by mouth at bedtime. 05/03/21  Yes [provider]  GLUCOSAMINE-CHONDROITIN PO Take 1 tablet by mouth daily.   Yes [provider]  hydrochlorothiazide (HYDRODIURIL) 25 MG tablet Take 25 mg by mouth daily. 05/11/21  Yes [provider]  Multiple Vitamin (MULTIVITAMIN WITH MINERALS) TABS Take 1 tablet by mouth daily.   Yes [provider]  Omega-3 Fatty Acids (FISH OIL PO) Take 1 capsule by mouth daily.   Yes [provider]  Suvorexant (BELSOMRA) 20 MG TABS Take 20 mg by mouth at bedtime.   Yes [provider]  tretinoin (RETIN-A) 0.05 % cream Apply 1 application topically at bedtime as needed (blemishes). 01/11/21  Yes [provider]  Vilazodone HCl (VIIBRYD) 40 MG  TABS Take 40 mg by mouth in the morning.   Yes [provider]  cyclobenzaprine (FLEXERIL) 10 MG tablet Take 10 mg by mouth at bedtime as needed for muscle spasms. 05/07/21   [provider]  traZODone (DESYREL) 50 MG tablet Take 1 tablet (50 mg total) by mouth at bedtime as needed and may repeat dose one time if needed for sleep. Patient not taking: No sig reported 02/05/19   Suella Broad, FNP    Family History Family History  Problem Relation Age of Onset   Breast cancer Sister 53    Social History Social History    Tobacco Use   Smoking status: Never   Smokeless tobacco: Never  Substance Use Topics   Alcohol use: Yes    Comment: socially   Drug use: No     Allergies   Anesthetic [benzocaine] and Penicillins   Review of Systems Review of Systems  Genitourinary:  Positive for decreased urine volume, dysuria, frequency and hematuria.    Physical Exam Triage Vital Signs ED Triage Vitals  Enc Vitals Group     BP 06/02/21 1405 119/81     Pulse Rate 06/02/21 1405 79     Resp 06/02/21 1405 16     Temp 06/02/21 1405 98.4 F (36.9 C)     Temp Source 06/02/21 1405 Oral     SpO2 06/02/21 1405 94 %     Weight --      Height --      Head Circumference --      Peak Flow --      Pain Score 06/02/21 1402 1     Pain Loc --      Pain Edu? --      Excl. in [place]? --    No data found.  Updated Vital Signs BP 119/81 (BP Location: Right Arm)   Pulse 79   Temp 98.4 F (36.9 C) (Oral)   Resp 16   LMP 03/17/2013   SpO2 94%       Physical Exam Vitals and nursing note reviewed.  Constitutional:      General: She is not in acute distress.    Appearance: Normal appearance. She is obese. She is ill-appearing.  HENT:     Head: Normocephalic and atraumatic.     Mouth/Throat:     Mouth: Mucous membranes are moist.     Pharynx: Oropharynx is clear.  Eyes:     Extraocular Movements: Extraocular movements intact.     Conjunctiva/sclera: Conjunctivae normal.     Pupils: Pupils are equal, round, and reactive to light.  Cardiovascular:     Rate and Rhythm: Normal rate and regular rhythm.     Pulses: Normal pulses.     Heart sounds: Normal heart sounds.  Pulmonary:     Effort: Pulmonary effort is normal. No respiratory distress.     Breath sounds: Normal breath sounds. No wheezing, rhonchi or rales.  Abdominal:     Tenderness: There is no right CVA tenderness or left CVA tenderness.  Musculoskeletal:        General: Normal range of motion.     Cervical back: Normal range of motion and neck  supple. No tenderness.  Lymphadenopathy:     Cervical: No cervical adenopathy.  Skin:    General: Skin is warm and dry.  Neurological:     General: No focal deficit present.     Mental Status: She is alert and oriented to person, place, and time.  Psychiatric:  Mood and Affect: Mood normal.        Behavior: Behavior normal.        Thought Content: Thought content normal.     UC Treatments / Results  Labs (all labs ordered are listed, but only abnormal results are displayed) Labs Reviewed  POCT URINALYSIS DIP (MANUAL ENTRY) - Abnormal; Notable for the following components:      Result Value   Clarity, UA cloudy (*)    Blood, UA moderate (*)    All other components within normal limits  URINE CULTURE    EKG   Radiology No results found.  Procedures Procedures (including critical care time)  Medications Ordered in UC Medications - No data to display  Initial Impression / Assessment and Plan / UC Course  I have reviewed the triage vital signs and the nursing notes.  Pertinent labs & imaging results that were available during my care of the patient were reviewed by me and considered in my medical decision making (see chart for details).    MDM: 1.  Dysuria-UA unremarkable with exception of moderate blood, 2.  Hematuria-advised patient this is most likely sequelae of malignant tumor of right kidney, advised to follow-up with her Urologist prior to procedure.  Patient discharged home, hemodynamically stable. Final Clinical Impressions(s) / UC Diagnoses   Final diagnoses:  Dysuria  Hematuria, unspecified type     Discharge Instructions      Advised patient we will follow-up with urine culture results once received.     ED Prescriptions   None    PDMP not reviewed this encounter.   Eliezer Lofts, [place] 06/02/21 1454

## 2021-06-02 NOTE — Discharge Instructions (Addendum)
Advised patient we will follow-up with urine culture results once received.

## 2021-06-02 NOTE — ED Triage Notes (Signed)
Patient presents to Urgent Care with complaints of dysuria since 1 day ago. Patient reports dysuria, frequency, urgency, decrease urine when going. Is having scheduled kidney surgery on Wed due to malignancy.  Wants records sent to [facility] [phone] fax [phone]

## 2021-06-03 ENCOUNTER — Encounter (HOSPITAL_COMMUNITY)
Admission: RE | Admit: 2021-06-03 | Discharge: 2021-06-03 | Disposition: A | Discharge status: Home / Self Care | Payer: BC Managed Care – PPO | Source: Ambulatory Visit | Attending: Urology | Admitting: Urology

## 2021-06-03 ENCOUNTER — Encounter (HOSPITAL_COMMUNITY): Payer: Self-pay

## 2021-06-03 ENCOUNTER — Other Ambulatory Visit (HOSPITAL_COMMUNITY)
Admission: RE | Admit: 2021-06-03 | Discharge: 2021-06-03 | Disposition: A | Discharge status: Home / Self Care | Payer: BC Managed Care – PPO | Source: Ambulatory Visit | Attending: Urology | Admitting: Urology

## 2021-06-03 DIAGNOSIS — S36430A Laceration of duodenum, initial encounter: Secondary | ICD-10-CM | POA: Diagnosis not present

## 2021-06-03 DIAGNOSIS — Z20822 Contact with and (suspected) exposure to covid-19: Secondary | ICD-10-CM | POA: Insufficient documentation

## 2021-06-03 DIAGNOSIS — F322 Major depressive disorder, single episode, severe without psychotic features: Secondary | ICD-10-CM | POA: Diagnosis not present

## 2021-06-03 DIAGNOSIS — C641 Malignant neoplasm of right kidney, except renal pelvis: Secondary | ICD-10-CM | POA: Diagnosis not present

## 2021-06-03 DIAGNOSIS — Z01812 Encounter for preprocedural laboratory examination: Secondary | ICD-10-CM | POA: Insufficient documentation

## 2021-06-03 DIAGNOSIS — F419 Anxiety disorder, unspecified: Secondary | ICD-10-CM | POA: Diagnosis not present

## 2021-06-03 DIAGNOSIS — Y836 Removal of other organ (partial) (total) as the cause of abnormal reaction of the patient, or of later complication, without mention of misadventure at the time of the procedure: Secondary | ICD-10-CM | POA: Diagnosis not present

## 2021-06-03 DIAGNOSIS — Z90711 Acquired absence of uterus with remaining cervical stump: Secondary | ICD-10-CM | POA: Diagnosis not present

## 2021-06-03 DIAGNOSIS — Z880 Allergy status to penicillin: Secondary | ICD-10-CM | POA: Diagnosis not present

## 2021-06-03 DIAGNOSIS — Z6841 Body Mass Index (BMI) 40.0 and over, adult: Secondary | ICD-10-CM | POA: Diagnosis not present

## 2021-06-03 DIAGNOSIS — K3189 Other diseases of stomach and duodenum: Secondary | ICD-10-CM | POA: Diagnosis not present

## 2021-06-03 DIAGNOSIS — M1990 Unspecified osteoarthritis, unspecified site: Secondary | ICD-10-CM | POA: Diagnosis not present

## 2021-06-03 DIAGNOSIS — Z881 Allergy status to other antibiotic agents status: Secondary | ICD-10-CM | POA: Diagnosis not present

## 2021-06-03 DIAGNOSIS — N2889 Other specified disorders of kidney and ureter: Secondary | ICD-10-CM | POA: Diagnosis not present

## 2021-06-03 DIAGNOSIS — J302 Other seasonal allergic rhinitis: Secondary | ICD-10-CM | POA: Diagnosis not present

## 2021-06-03 DIAGNOSIS — Z79899 Other long term (current) drug therapy: Secondary | ICD-10-CM | POA: Diagnosis not present

## 2021-06-03 DIAGNOSIS — Y92234 Operating room of hospital as the place of occurrence of the external cause: Secondary | ICD-10-CM | POA: Diagnosis not present

## 2021-06-03 DIAGNOSIS — F32A Depression, unspecified: Secondary | ICD-10-CM | POA: Diagnosis not present

## 2021-06-03 DIAGNOSIS — E669 Obesity, unspecified: Secondary | ICD-10-CM | POA: Diagnosis not present

## 2021-06-03 HISTORY — DX: Unspecified osteoarthritis, unspecified site: M19.90

## 2021-06-03 LAB — CBC
HCT: 46.3 % — ABNORMAL HIGH (ref 36.0–46.0)
Hemoglobin: 15.1 g/dL — ABNORMAL HIGH (ref 12.0–15.0)
MCH: 26.9 pg (ref 26.0–34.0)
MCHC: 32.6 g/dL (ref 30.0–36.0)
MCV: 82.5 fL (ref 80.0–100.0)
Platelets: 242 10*3/uL (ref 150–400)
RBC: 5.61 MIL/uL — ABNORMAL HIGH (ref 3.87–5.11)
RDW: 13.5 % (ref 11.5–15.5)
WBC: 5.8 10*3/uL (ref 4.0–10.5)
nRBC: 0 % (ref 0.0–0.2)

## 2021-06-03 LAB — COMPREHENSIVE METABOLIC PANEL
ALT: 22 U/L (ref 0–44)
AST: 23 U/L (ref 15–41)
Albumin: 4.4 g/dL (ref 3.5–5.0)
Alkaline Phosphatase: 58 U/L (ref 38–126)
Anion gap: 9 (ref 5–15)
BUN: 18 mg/dL (ref 6–20)
CO2: 27 mmol/L (ref 22–32)
Calcium: 9.4 mg/dL (ref 8.9–10.3)
Chloride: 102 mmol/L (ref 98–111)
Creatinine, Ser: 0.67 mg/dL (ref 0.44–1.00)
GFR, Estimated: >60 mL/min (ref >60)
Glucose, Bld: 117 mg/dL — ABNORMAL HIGH (ref 70–99)
Potassium: 4.3 mmol/L (ref 3.5–5.1)
Sodium: 138 mmol/L (ref 135–145)
Total Bilirubin: 0.8 mg/dL (ref 0.3–1.2)
Total Protein: 7.7 g/dL (ref 6.5–8.1)

## 2021-06-03 LAB — SARS CORONAVIRUS 2 (TAT 6-24 HRS): SARS Coronavirus 2: NEGATIVE

## 2021-06-04 LAB — URINE CULTURE
MICRO NUMBER:: 12129828
SPECIMEN QUALITY:: ADEQUATE

## 2021-06-04 NOTE — Anesthesia Preprocedure Evaluation (Addendum)
Anesthesia Evaluation  Patient identified by MRN, date of birth, ID band Patient awake    History of Anesthesia Complications (+) PONV  Airway Mallampati: II  TM Distance: >3 FB     Dental   Pulmonary    breath sounds clear to auscultation       Cardiovascular negative cardio ROS   Rhythm:Regular Rate:Normal     Neuro/Psych    GI/Hepatic negative GI ROS, Neg liver ROS,   Endo/Other  negative endocrine ROS  Renal/GU      Musculoskeletal  (+) Arthritis ,   Abdominal   Peds  Hematology   Anesthesia Other Findings   Reproductive/Obstetrics                            Anesthesia Physical Anesthesia Plan  ASA: 2  Anesthesia Plan: General   Post-op Pain Management:    Induction: Intravenous  PONV Risk Score and Plan: 4 or greater and Ondansetron, Dexamethasone and Midazolam  Airway Management Planned: Oral ETT  Additional Equipment:   Intra-op Plan:   Post-operative Plan: Possible Post-op intubation/ventilation  Informed Consent:     Dental advisory given  Plan Discussed with: Anesthesiologist and CRNA  Anesthesia Plan Comments:        Anesthesia Quick Evaluation

## 2021-06-04 NOTE — H&P (Signed)
CC: Renal Mass  HPI: Angelica Morgan is a 55 year-old female established patient who is here further eval and management of a renal mass.  The mass is on the right side.   The lesion(s) was first noted on approximately 05/23/2021. The mass was seen on CT Scan.   Her symptoms include blood in urine. Patient denies having flank pain, back pain, groin pain, nausea, vomiting, fever, and chills. She has seen blood in her urine. She does have a good appetite. She is not having pain in new locations. She has not recently had unwanted weight loss.   She has had previous abdominal surgery. Her past surgical history includes: hysterectomy. The patient can walk a flight of steps.   The patient denies history of diabetes, heart attack or stroke. There is not a a family history of kidney cancer. There is a family history of brain tumors (AMLs), seizures or brain aneurysm's.   Mom w/ 2 brain tumors at young age, both treated and she lived 47 yrs after surgery.   PMH - obesity and depression     ALLERGIES: Penicillins Septra    MEDICATIONS: Belsomra  Cymbalta  Gabapentin  Viibryd  Xanax PRN     GU PSH: Locm 300-399Mg /Ml Iodine,1Ml - 05/23/2021     NON-GU PSH: Cesarean Delivery Partial Hysterectomy     GU PMH: Gross hematuria - 05/23/2021, - 05/14/2021    NON-GU PMH: Anxiety Arthritis Depression    FAMILY HISTORY: 2 daughters - Daughter 1 son - Son Heart Disease - Runs in Family    Notes: Brother born with only one kidney   SOCIAL HISTORY: Marital Status: Married Preferred Language: English; Ethnicity: Not Hispanic Or Latino; Race: White Current Smoking Status: Patient has never smoked.   Tobacco Use Assessment Completed: Used Tobacco in last 30 days? Social Drinker.  Drinks 2 caffeinated drinks per day.    REVIEW OF SYSTEMS:    GU Review Female:   Patient denies frequent urination, hard to postpone urination, burning /pain with urination, get up at night to urinate, leakage of  urine, stream starts and stops, trouble starting your stream, have to strain to urinate, and being pregnant.  Gastrointestinal (Upper):   Patient denies nausea, vomiting, and indigestion/ heartburn.  Gastrointestinal (Lower):   Patient denies diarrhea and constipation.  Constitutional:   Patient denies fever, night sweats, weight loss, and fatigue.  Skin:   Patient denies skin rash/ lesion and itching.  Eyes:   Patient denies blurred vision and double vision.  Ears/ Nose/ Throat:   Patient denies sore throat and sinus problems.  Hematologic/Lymphatic:   Patient denies swollen glands and easy bruising.  Cardiovascular:   Patient denies leg swelling and chest pains.  Respiratory:   Patient denies cough and shortness of breath.  Endocrine:   Patient denies excessive thirst.  Musculoskeletal:   Patient denies back pain and joint pain.  Neurological:   Patient denies headaches and dizziness.  Psychologic:   Patient denies depression and anxiety.   VITAL SIGNS:      05/30/2021 10:51 AM  Weight 220 lb / 99.79 kg  Height 63 in / 160.02 cm  BP 104/72 mmHg  Pulse 80 /min  Temperature 97.8 F / 36.5 C  BMI 39.0 kg/m   MULTI-SYSTEM PHYSICAL EXAMINATION:    Constitutional: Obese. No physical deformities. Normally developed. Good grooming.   Respiratory: Normal breath sounds. No labored breathing, no use of accessory muscles.   Cardiovascular: Regular rate and rhythm. No murmur, no gallop. Normal  temperature, normal extremity pulses, no swelling, no varicosities.      Complexity of Data:  Source Of History:  Patient  Lab Test Review:   CMP  Records Review:   Previous Doctor Records, Previous Patient Records  Urine Test Review:   Urinalysis  X-Ray Review: C.T. Abdomen/Pelvis: Reviewed Films. Discussed With Patient.     PROCEDURES:         C.T. Chest w/ Contrast - 71260, R6789      omnipaque 300 100cc . Patient confirmed No Neulasta OnPro Device.           Urinalysis w/Scope Dipstick  Dipstick Cont'd Micro  Color: Yellow Bilirubin: Neg mg/dL WBC/hpf: 0 - 5/hpf  Appearance: Cloudy Ketones: Neg mg/dL RBC/hpf: 3 - 10/hpf  Specific Gravity: 1.025 Blood: Trace ery/uL Bacteria: Few (10-25/hpf)  pH: 6.0 Protein: Neg mg/dL Cystals: NS (Not Seen)  Glucose: Neg mg/dL Urobilinogen: 0.2 mg/dL Casts: NS (Not Seen)    Nitrites: Neg Trichomonas: Not Present    Leukocyte Esterase: Neg leu/uL Mucous: Not Present      Epithelial Cells: 10 - 20/hpf      Yeast: NS (Not Seen)      Sperm: Not Present    ASSESSMENT:      ICD-10 Details  1 GU:   Right renal neoplasm - D49.511    PLAN:           Orders X-Rays: C.T. Chest With I.V. Contrast  X-Ray Notes: History:  Hematuria: Yes/No  Patient to see MD after exam: Yes/No  Previous exam: CT / IVP/ US/ KUB/ None  When:  Where:  Diabetic: Yes/ No  BUN/ Creatine:  Date of last BUN Creatinine:  Weight in pounds:  Allergy- Contrasts/ Shellfish: Yes/ No  Conflicting diabetic meds: Yes/ No  Oral contrast and instructions given to patient:   Prior Authorization #: [id number] valid 05/30/21 thru 06/28/21            Schedule Return Visit/Planned Activity: ASAP - Schedule Surgery          Document Letter(s):  Created for Patient: Clinical Summary    I detailed the various treatment options with the patient and ultimately I recommended a laparoscopic radical nephrectomy. I went over this operation in detail with the patient including the risks and benefits. We discussed port position and I outlined the position of the 4 planned trocars. I also explained to them that they will need extraction incision which typically is in the lower quadrant, connecting to the two lower lateral incisions. I discussed the actual surgery with them and we went over the various structures that are in intimate association with the kidney and the risk of damage thereof. I outlined the risk of injury to the major nerves and vessels in proximity to the  kidney. I explained the patient the expected hospital course. I told them that they should plan to be in the hospital at least 2-3 days. Further, I told them that they would likely need approximately 1 month to fully recover. Given the severity of this planned operation, we will have the patient be seen by their primary care doctor and cleared for surgery. We will plan to schedule this as soon as possible.         Notes:   11.5 cm right lower pole mass. No renal vein thrombus, simple renal hilum. CT scan of chest pending. Scheduled for OR next week.   cc: Marylynn Pearson, MD        Next Appointment:  Next Appointment: 06/05/2021 08:30 AM    Appointment Type: Surgery     Location: [facility] [phone]    Provider: Louis Meckel, M.D.    Reason for Visit: WL/IP LAP RAD NEPHRECTOMY WITH RESIDENT

## 2021-06-05 ENCOUNTER — Encounter (HOSPITAL_COMMUNITY): Payer: Self-pay | Admitting: Urology

## 2021-06-05 ENCOUNTER — Inpatient Hospital Stay (HOSPITAL_COMMUNITY)
Admission: RE | Admit: 2021-06-05 | Discharge: 2021-06-06 | DRG: 657 | Disposition: A | Discharge status: Home / Self Care | Payer: BC Managed Care – PPO | Attending: Urology | Admitting: Urology

## 2021-06-05 ENCOUNTER — Inpatient Hospital Stay (HOSPITAL_COMMUNITY): Payer: BC Managed Care – PPO | Admitting: Certified Registered Nurse Anesthetist

## 2021-06-05 ENCOUNTER — Encounter (HOSPITAL_COMMUNITY)
Admission: RE | Disposition: A | Discharge status: Home / Self Care | Payer: Self-pay | Source: Home / Self Care | Attending: Urology

## 2021-06-05 DIAGNOSIS — Z90711 Acquired absence of uterus with remaining cervical stump: Secondary | ICD-10-CM | POA: Diagnosis not present

## 2021-06-05 DIAGNOSIS — Z79899 Other long term (current) drug therapy: Secondary | ICD-10-CM

## 2021-06-05 DIAGNOSIS — S36430A Laceration of duodenum, initial encounter: Secondary | ICD-10-CM | POA: Diagnosis not present

## 2021-06-05 DIAGNOSIS — Y92234 Operating room of hospital as the place of occurrence of the external cause: Secondary | ICD-10-CM | POA: Diagnosis not present

## 2021-06-05 DIAGNOSIS — F419 Anxiety disorder, unspecified: Secondary | ICD-10-CM | POA: Diagnosis present

## 2021-06-05 DIAGNOSIS — Z881 Allergy status to other antibiotic agents status: Secondary | ICD-10-CM | POA: Diagnosis not present

## 2021-06-05 DIAGNOSIS — Z6841 Body Mass Index (BMI) 40.0 and over, adult: Secondary | ICD-10-CM | POA: Diagnosis not present

## 2021-06-05 DIAGNOSIS — Z880 Allergy status to penicillin: Secondary | ICD-10-CM | POA: Diagnosis not present

## 2021-06-05 DIAGNOSIS — Y836 Removal of other organ (partial) (total) as the cause of abnormal reaction of the patient, or of later complication, without mention of misadventure at the time of the procedure: Secondary | ICD-10-CM | POA: Diagnosis not present

## 2021-06-05 DIAGNOSIS — Z20822 Contact with and (suspected) exposure to covid-19: Secondary | ICD-10-CM | POA: Diagnosis present

## 2021-06-05 DIAGNOSIS — F32A Depression, unspecified: Secondary | ICD-10-CM | POA: Diagnosis present

## 2021-06-05 DIAGNOSIS — C641 Malignant neoplasm of right kidney, except renal pelvis: Secondary | ICD-10-CM | POA: Diagnosis not present

## 2021-06-05 DIAGNOSIS — K3189 Other diseases of stomach and duodenum: Secondary | ICD-10-CM | POA: Diagnosis not present

## 2021-06-05 DIAGNOSIS — E669 Obesity, unspecified: Secondary | ICD-10-CM | POA: Diagnosis present

## 2021-06-05 DIAGNOSIS — N2889 Other specified disorders of kidney and ureter: Secondary | ICD-10-CM | POA: Diagnosis present

## 2021-06-05 HISTORY — PX: LAPAROSCOPIC NEPHRECTOMY: SHX1930

## 2021-06-05 LAB — POCT I-STAT EG7
Acid-base deficit: 3 mmol/L — ABNORMAL HIGH (ref 0.0–2.0)
Bicarbonate: 24.6 mmol/L (ref 20.0–28.0)
Calcium, Ion: 1.17 mmol/L (ref 1.15–1.40)
HCT: 34 % — ABNORMAL LOW (ref 36.0–46.0)
Hemoglobin: 11.6 g/dL — ABNORMAL LOW (ref 12.0–15.0)
O2 Saturation: 85 %
Patient temperature: 35.8
Potassium: 3.9 mmol/L (ref 3.5–5.1)
Sodium: 139 mmol/L (ref 135–145)
TCO2: 26 mmol/L (ref 22–32)
pCO2, Ven: 51.5 mmHg (ref 44.0–60.0)
pH, Ven: 7.281 (ref 7.250–7.430)
pO2, Ven: 53 mmHg — ABNORMAL HIGH (ref 32.0–45.0)

## 2021-06-05 LAB — HEMOGLOBIN AND HEMATOCRIT, BLOOD
HCT: 37.5 % (ref 36.0–46.0)
Hemoglobin: 11.9 g/dL — ABNORMAL LOW (ref 12.0–15.0)

## 2021-06-05 LAB — ABO/RH: ABO/RH(D): B POS

## 2021-06-05 SURGERY — NEPHRECTOMY, RADICAL, LAPAROSCOPIC, ADULT
Anesthesia: General | Laterality: Right

## 2021-06-05 MED ORDER — LORATADINE 10 MG PO TABS
10.0000 mg | ORAL_TABLET | Freq: Every day | ORAL | Status: DC
  Administered 2021-06-06: 10 mg via ORAL
  Filled 2021-06-05: qty 1

## 2021-06-05 MED ORDER — HYDROMORPHONE HCL 2 MG/ML IJ SOLN
INTRAMUSCULAR | Status: AC
  Filled 2021-06-05: qty 1

## 2021-06-05 MED ORDER — ROCURONIUM BROMIDE 10 MG/ML (PF) SYRINGE
PREFILLED_SYRINGE | INTRAVENOUS | Status: DC | PRN
  Administered 2021-06-05: 20 mg via INTRAVENOUS
  Administered 2021-06-05: 50 mg via INTRAVENOUS
  Administered 2021-06-05 (×2): 20 mg via INTRAVENOUS
  Administered 2021-06-05: 30 mg via INTRAVENOUS
  Administered 2021-06-05 (×3): 20 mg via INTRAVENOUS

## 2021-06-05 MED ORDER — DULOXETINE HCL 60 MG PO CPEP
60.0000 mg | ORAL_CAPSULE | Freq: Every day | ORAL | Status: DC
  Filled 2021-06-05: qty 1

## 2021-06-05 MED ORDER — MIDAZOLAM HCL 5 MG/5ML IJ SOLN
INTRAMUSCULAR | Status: DC | PRN
  Administered 2021-06-05: 2 mg via INTRAVENOUS

## 2021-06-05 MED ORDER — SUCCINYLCHOLINE CHLORIDE 200 MG/10ML IV SOSY
PREFILLED_SYRINGE | INTRAVENOUS | Status: AC
  Filled 2021-06-05: qty 10

## 2021-06-05 MED ORDER — ACETAMINOPHEN 10 MG/ML IV SOLN
1000.0000 mg | Freq: Four times a day (QID) | INTRAVENOUS | Status: AC
  Administered 2021-06-05 – 2021-06-06 (×4): 1000 mg via INTRAVENOUS
  Filled 2021-06-05 (×4): qty 100

## 2021-06-05 MED ORDER — LACTATED RINGERS IV SOLN: INTRAVENOUS | Status: DC

## 2021-06-05 MED ORDER — BUPIVACAINE-EPINEPHRINE 0.5% -1:200000 IJ SOLN
INTRAMUSCULAR | Status: DC | PRN
  Administered 2021-06-05: 30 mL

## 2021-06-05 MED ORDER — VILAZODONE HCL 20 MG PO TABS
40.0000 mg | ORAL_TABLET | Freq: Every day | ORAL | Status: DC
  Filled 2021-06-05: qty 2

## 2021-06-05 MED ORDER — ALBUMIN HUMAN 5 % IV SOLN: INTRAVENOUS | Status: DC | PRN

## 2021-06-05 MED ORDER — CLINDAMYCIN PHOSPHATE 900 MG/50ML IV SOLN
900.0000 mg | INTRAVENOUS | Status: AC
  Administered 2021-06-05: 900 mg via INTRAVENOUS
  Filled 2021-06-05: qty 50

## 2021-06-05 MED ORDER — HYDROMORPHONE HCL 1 MG/ML IJ SOLN
INTRAMUSCULAR | Status: DC | PRN
  Administered 2021-06-05 (×4): .5 mg via INTRAVENOUS

## 2021-06-05 MED ORDER — CHLORHEXIDINE GLUCONATE 0.12 % MT SOLN
15.0000 mL | Freq: Once | OROMUCOSAL | Status: AC
  Administered 2021-06-05: 15 mL via OROMUCOSAL

## 2021-06-05 MED ORDER — BUPIVACAINE LIPOSOME 1.3 % IJ SUSP
20.0000 mL | Freq: Once | INTRAMUSCULAR | Status: AC
  Administered 2021-06-05: 20 mL
  Filled 2021-06-05: qty 20

## 2021-06-05 MED ORDER — PHENYLEPHRINE HCL (PRESSORS) 10 MG/ML IV SOLN
INTRAVENOUS | Status: AC
  Filled 2021-06-05: qty 1

## 2021-06-05 MED ORDER — SUCCINYLCHOLINE CHLORIDE 200 MG/10ML IV SOSY
PREFILLED_SYRINGE | INTRAVENOUS | Status: DC | PRN
  Administered 2021-06-05: 140 mg via INTRAVENOUS

## 2021-06-05 MED ORDER — EPHEDRINE 5 MG/ML INJ
INTRAVENOUS | Status: AC
  Filled 2021-06-05: qty 5

## 2021-06-05 MED ORDER — LACTATED RINGERS IR SOLN
Status: DC | PRN
  Administered 2021-06-05: 1000 mL

## 2021-06-05 MED ORDER — DEXAMETHASONE SODIUM PHOSPHATE 10 MG/ML IJ SOLN
INTRAMUSCULAR | Status: DC | PRN
  Administered 2021-06-05: 8 mg via INTRAVENOUS

## 2021-06-05 MED ORDER — VILAZODONE HCL 40 MG PO TABS: 40.0000 mg | ORAL_TABLET | Freq: Every morning | ORAL | Status: DC

## 2021-06-05 MED ORDER — MAGNESIUM CITRATE PO SOLN
1.0000 | Freq: Once | ORAL | Status: DC
  Filled 2021-06-05: qty 296

## 2021-06-05 MED ORDER — MORPHINE SULFATE (PF) 2 MG/ML IV SOLN: 2.0000 mg | INTRAVENOUS | Status: DC | PRN

## 2021-06-05 MED ORDER — FENTANYL CITRATE (PF) 100 MCG/2ML IJ SOLN
INTRAMUSCULAR | Status: AC
  Filled 2021-06-05: qty 2

## 2021-06-05 MED ORDER — 0.9 % SODIUM CHLORIDE (POUR BTL) OPTIME
TOPICAL | Status: DC | PRN
  Administered 2021-06-05: 1000 mL

## 2021-06-05 MED ORDER — CLINDAMYCIN PHOSPHATE 600 MG/50ML IV SOLN
600.0000 mg | Freq: Three times a day (TID) | INTRAVENOUS | Status: DC
  Administered 2021-06-06 (×2): 600 mg via INTRAVENOUS
  Filled 2021-06-05 (×4): qty 50

## 2021-06-05 MED ORDER — PROPOFOL 10 MG/ML IV BOLUS
INTRAVENOUS | Status: AC
  Filled 2021-06-05: qty 20

## 2021-06-05 MED ORDER — LIDOCAINE 2% (20 MG/ML) 5 ML SYRINGE
INTRAMUSCULAR | Status: AC
  Filled 2021-06-05: qty 5

## 2021-06-05 MED ORDER — PROMETHAZINE HCL 25 MG/ML IJ SOLN: 6.2500 mg | Freq: Once | INTRAMUSCULAR | Status: AC

## 2021-06-05 MED ORDER — ONDANSETRON HCL 4 MG/2ML IJ SOLN
INTRAMUSCULAR | Status: DC | PRN
  Administered 2021-06-05: 4 mg via INTRAVENOUS

## 2021-06-05 MED ORDER — KETAMINE HCL 10 MG/ML IJ SOLN
INTRAMUSCULAR | Status: AC
  Filled 2021-06-05: qty 1

## 2021-06-05 MED ORDER — LATANOPROST 0.005 % OP SOLN
1.0000 [drp] | Freq: Every day | OPHTHALMIC | Status: DC
  Filled 2021-06-05: qty 2.5

## 2021-06-05 MED ORDER — MIDAZOLAM HCL 2 MG/2ML IJ SOLN
INTRAMUSCULAR | Status: AC
  Filled 2021-06-05: qty 2

## 2021-06-05 MED ORDER — ALBUMIN HUMAN 5 % IV SOLN
INTRAVENOUS | Status: AC
  Filled 2021-06-05: qty 250

## 2021-06-05 MED ORDER — DEXAMETHASONE SODIUM PHOSPHATE 10 MG/ML IJ SOLN
INTRAMUSCULAR | Status: AC
  Filled 2021-06-05: qty 1

## 2021-06-05 MED ORDER — ONDANSETRON HCL 4 MG/2ML IJ SOLN
INTRAMUSCULAR | Status: AC
  Filled 2021-06-05: qty 2

## 2021-06-05 MED ORDER — SUGAMMADEX SODIUM 200 MG/2ML IV SOLN
INTRAVENOUS | Status: DC | PRN
  Administered 2021-06-05: 200 mg via INTRAVENOUS

## 2021-06-05 MED ORDER — ALPRAZOLAM 0.5 MG PO TABS: 0.5000 mg | ORAL_TABLET | Freq: Two times a day (BID) | ORAL | Status: DC | PRN

## 2021-06-05 MED ORDER — SUVOREXANT 20 MG PO TABS: 20.0000 mg | ORAL_TABLET | Freq: Every day | ORAL | Status: DC

## 2021-06-05 MED ORDER — PHENYLEPHRINE 40 MCG/ML (10ML) SYRINGE FOR IV PUSH (FOR BLOOD PRESSURE SUPPORT)
PREFILLED_SYRINGE | INTRAVENOUS | Status: DC | PRN
  Administered 2021-06-05 (×2): 200 ug via INTRAVENOUS

## 2021-06-05 MED ORDER — CYCLOBENZAPRINE HCL 5 MG PO TABS: 5.0000 mg | ORAL_TABLET | Freq: Every evening | ORAL | Status: DC | PRN

## 2021-06-05 MED ORDER — LACTATED RINGERS IV SOLN: INTRAVENOUS | Status: DC | PRN

## 2021-06-05 MED ORDER — DEXTROSE-NACL 5-0.45 % IV SOLN: INTRAVENOUS | Status: DC

## 2021-06-05 MED ORDER — EPHEDRINE SULFATE-NACL 50-0.9 MG/10ML-% IV SOSY
PREFILLED_SYRINGE | INTRAVENOUS | Status: DC | PRN
  Administered 2021-06-05: 5 mg via INTRAVENOUS

## 2021-06-05 MED ORDER — ORAL CARE MOUTH RINSE: 15.0000 mL | Freq: Once | OROMUCOSAL | Status: AC

## 2021-06-05 MED ORDER — ZOLPIDEM TARTRATE 5 MG PO TABS: 5.0000 mg | ORAL_TABLET | Freq: Every evening | ORAL | Status: DC | PRN

## 2021-06-05 MED ORDER — ROCURONIUM BROMIDE 10 MG/ML (PF) SYRINGE
PREFILLED_SYRINGE | INTRAVENOUS | Status: AC
  Filled 2021-06-05: qty 10

## 2021-06-05 MED ORDER — BUPIVACAINE-EPINEPHRINE (PF) 0.5% -1:200000 IJ SOLN
INTRAMUSCULAR | Status: AC
  Filled 2021-06-05: qty 30

## 2021-06-05 MED ORDER — DOCUSATE SODIUM 100 MG PO CAPS
100.0000 mg | ORAL_CAPSULE | Freq: Two times a day (BID) | ORAL | Status: DC
  Administered 2021-06-05 – 2021-06-06 (×2): 100 mg via ORAL
  Filled 2021-06-05 (×2): qty 1

## 2021-06-05 MED ORDER — LIDOCAINE 2% (20 MG/ML) 5 ML SYRINGE
INTRAMUSCULAR | Status: DC | PRN
  Administered 2021-06-05: 100 mg via INTRAVENOUS

## 2021-06-05 MED ORDER — GABAPENTIN 300 MG PO CAPS
900.0000 mg | ORAL_CAPSULE | Freq: Every day | ORAL | Status: DC
  Administered 2021-06-05: 900 mg via ORAL
  Filled 2021-06-05: qty 3

## 2021-06-05 MED ORDER — PHENYLEPHRINE HCL-NACL 10-0.9 MG/250ML-% IV SOLN
INTRAVENOUS | Status: DC | PRN
  Administered 2021-06-05: 40 ug/min via INTRAVENOUS
  Administered 2021-06-05: 50 ug/min via INTRAVENOUS

## 2021-06-05 MED ORDER — KETAMINE HCL 10 MG/ML IJ SOLN
INTRAMUSCULAR | Status: DC | PRN
  Administered 2021-06-05: 50 mg via INTRAVENOUS

## 2021-06-05 MED ORDER — PROMETHAZINE HCL 25 MG/ML IJ SOLN
INTRAMUSCULAR | Status: AC
  Administered 2021-06-05: 6.25 mg via INTRAVENOUS
  Filled 2021-06-05: qty 1

## 2021-06-05 MED ORDER — PROPOFOL 10 MG/ML IV BOLUS
INTRAVENOUS | Status: DC | PRN
  Administered 2021-06-05: 150 mg via INTRAVENOUS

## 2021-06-05 MED ORDER — SODIUM CHLORIDE 0.9 % IR SOLN
Status: DC | PRN
  Administered 2021-06-05: 50 mL

## 2021-06-05 MED ORDER — FENTANYL CITRATE (PF) 100 MCG/2ML IJ SOLN
INTRAMUSCULAR | Status: DC | PRN
  Administered 2021-06-05 (×2): 50 ug via INTRAVENOUS

## 2021-06-05 MED ORDER — KETOROLAC TROMETHAMINE 15 MG/ML IJ SOLN
15.0000 mg | Freq: Four times a day (QID) | INTRAMUSCULAR | Status: DC
  Administered 2021-06-05 – 2021-06-06 (×3): 15 mg via INTRAVENOUS
  Filled 2021-06-05 (×4): qty 1

## 2021-06-05 MED ORDER — FENTANYL CITRATE (PF) 100 MCG/2ML IJ SOLN: 25.0000 ug | INTRAMUSCULAR | Status: DC | PRN

## 2021-06-05 MED ORDER — SODIUM CHLORIDE (PF) 0.9 % IJ SOLN
INTRAMUSCULAR | Status: AC
  Filled 2021-06-05: qty 20

## 2021-06-05 MED ORDER — ONDANSETRON HCL 4 MG/2ML IJ SOLN: 4.0000 mg | INTRAMUSCULAR | Status: DC | PRN

## 2021-06-05 MED ORDER — SCOPOLAMINE 1 MG/3DAYS TD PT72
MEDICATED_PATCH | TRANSDERMAL | Status: AC
  Filled 2021-06-05: qty 1

## 2021-06-05 MED ORDER — TRAMADOL HCL 50 MG PO TABS
50.0000 mg | ORAL_TABLET | Freq: Four times a day (QID) | ORAL | Status: DC | PRN
  Administered 2021-06-06: 50 mg via ORAL
  Filled 2021-06-05: qty 1

## 2021-06-05 SURGICAL SUPPLY — 67 items
APPLICATOR ARISTA FLEXITIP XL (MISCELLANEOUS) IMPLANT
APPLICATOR SURGIFLO ENDO (HEMOSTASIS) IMPLANT
APPLIER CLIP ROT 10 11.4 M/L (STAPLE)
BAG COUNTER SPONGE SURGICOUNT (BAG) ×2 IMPLANT
BAG LAPAROSCOPIC 12 15 PORT 16 (BASKET) ×1 IMPLANT
BAG RETRIEVAL 12/15 (BASKET) ×2
BAG ZIPLOCK 12X15 (MISCELLANEOUS) IMPLANT
BLADE EXTENDED COATED 6.5IN (ELECTRODE) IMPLANT
BLADE SURG SZ10 CARB STEEL (BLADE) IMPLANT
CHLORAPREP W/TINT 26 (MISCELLANEOUS) ×2 IMPLANT
CLIP APPLIE ROT 10 11.4 M/L (STAPLE) IMPLANT
CLIP VESOLOCK LG 6/CT PURPLE (CLIP) ×2 IMPLANT
CLIP VESOLOCK MED LG 6/CT (CLIP) ×4 IMPLANT
CLIP VESOLOCK XL 6/CT (CLIP) ×2 IMPLANT
CUTTER FLEX LINEAR 45M (STAPLE) ×2 IMPLANT
DECANTER SPIKE VIAL GLASS SM (MISCELLANEOUS) ×2 IMPLANT
DERMABOND ADVANCED (GAUZE/BANDAGES/DRESSINGS) ×1
DERMABOND ADVANCED .7 DNX12 (GAUZE/BANDAGES/DRESSINGS) ×1 IMPLANT
DRAIN CHANNEL 15F RND FF 3/16 (WOUND CARE) ×2 IMPLANT
DRAPE INCISE IOBAN 66X45 STRL (DRAPES) ×2 IMPLANT
DRAPE WARM FLUID 44X44 (DRAPES) IMPLANT
DRSG TEGADERM 4X4.75 (GAUZE/BANDAGES/DRESSINGS) ×2 IMPLANT
ELECT PENCIL ROCKER SW 15FT (MISCELLANEOUS) ×2 IMPLANT
ELECT REM PT RETURN 15FT ADLT (MISCELLANEOUS) ×2 IMPLANT
EVACUATOR SILICONE 100CC (DRAIN) ×2 IMPLANT
GAUZE 4X4 16PLY ~~LOC~~+RFID DBL (SPONGE) ×2 IMPLANT
GLOVE SURG ENC TEXT LTX SZ7.5 (GLOVE) ×2 IMPLANT
GOWN STRL REUS W/TWL LRG LVL3 (GOWN DISPOSABLE) ×4 IMPLANT
HEMOSTAT ARISTA ABSORB 3G PWDR (HEMOSTASIS) IMPLANT
HEMOSTAT SURGICEL 4X8 (HEMOSTASIS) IMPLANT
IRRIG SUCT STRYKERFLOW 2 WTIP (MISCELLANEOUS) ×2
IRRIGATION SUCT STRKRFLW 2 WTP (MISCELLANEOUS) ×1 IMPLANT
KIT BASIN OR (CUSTOM PROCEDURE TRAY) ×2 IMPLANT
KIT TURNOVER KIT A (KITS) ×2 IMPLANT
MANIFOLD NEPTUNE II (INSTRUMENTS) ×2 IMPLANT
MARKER SKIN DUAL TIP RULER LAB (MISCELLANEOUS) ×2 IMPLANT
NEEDLE INSUFFLATION 14GA 120MM (NEEDLE) IMPLANT
NEEDLE SPNL 22GX3.5 QUINCKE BK (NEEDLE) IMPLANT
PAD POSITIONING PINK XL (MISCELLANEOUS) ×2 IMPLANT
PROTECTOR NERVE ULNAR (MISCELLANEOUS) ×4 IMPLANT
RELOAD 45 VASCULAR/THIN (ENDOMECHANICALS) ×8 IMPLANT
RELOAD STAPLE TA45 3.5 REG BLU (ENDOMECHANICALS) IMPLANT
SCISSORS LAP 5X35 DISP (ENDOMECHANICALS) ×2 IMPLANT
SET TUBE SMOKE EVAC HIGH FLOW (TUBING) ×2 IMPLANT
SHEARS HARMONIC ACE PLUS 36CM (ENDOMECHANICALS) ×2 IMPLANT
SLEEVE XCEL OPT CAN 5 100 (ENDOMECHANICALS) ×6 IMPLANT
SOL ANTI FOG 6CC (MISCELLANEOUS) ×1 IMPLANT
SOLUTION ANTI FOG 6CC (MISCELLANEOUS) ×1
SPONGE DRAIN TRACH 4X4 STRL 2S (GAUZE/BANDAGES/DRESSINGS) ×2 IMPLANT
SPONGE T-LAP 18X18 ~~LOC~~+RFID (SPONGE) ×4 IMPLANT
SPONGE T-LAP 4X18 ~~LOC~~+RFID (SPONGE) ×4 IMPLANT
SUT ETHILON 3 0 PS 1 (SUTURE) ×2 IMPLANT
SUT MNCRL AB 4-0 PS2 18 (SUTURE) ×4 IMPLANT
SUT PDS AB 0 CT1 36 (SUTURE) ×2 IMPLANT
SUT VIC AB 2-0 CT1 27 (SUTURE)
SUT VIC AB 2-0 CT1 27XBRD (SUTURE) IMPLANT
SUT VICRYL 0 UR6 27IN ABS (SUTURE) ×2 IMPLANT
SUT VLOC 180 2-0 6IN GS21 (SUTURE) ×4 IMPLANT
TAPE CLOTH 4X10 WHT NS (GAUZE/BANDAGES/DRESSINGS) IMPLANT
TOWEL OR 17X26 10 PK STRL BLUE (TOWEL DISPOSABLE) ×2 IMPLANT
TOWEL OR NON WOVEN STRL DISP B (DISPOSABLE) ×2 IMPLANT
TRAY FOLEY MTR SLVR 14FR STAT (SET/KITS/TRAYS/PACK) ×2 IMPLANT
TRAY FOLEY SLVR 14FR TEMP STAT (SET/KITS/TRAYS/PACK) ×2 IMPLANT
TRAY LAPAROSCOPIC (CUSTOM PROCEDURE TRAY) ×2 IMPLANT
TROCAR BLADELESS OPT 5 100 (ENDOMECHANICALS) ×2 IMPLANT
TROCAR UNIVERSAL OPT 12M 100M (ENDOMECHANICALS) ×2 IMPLANT
TROCAR XCEL 12X100 BLDLESS (ENDOMECHANICALS) ×2 IMPLANT

## 2021-06-05 NOTE — Transfer of Care (Signed)
Immediate Anesthesia Transfer of Care Note  Patient: Angelica Morgan  Procedure(s) Performed: LAPAROSCOPIC  RADICAL NEPHRECTOMY WITH DUDODENAL REPAIR WITH OMENTOPEXY (Right)  Patient Location: PACU  Anesthesia Type:General  Level of Consciousness: awake, alert  and oriented  Airway & Oxygen Therapy: Patient Spontanous Breathing and Patient connected to face mask  Post-op Assessment: Report given to RN and Post -op Vital signs reviewed and stable  Post vital signs: Reviewed and stable  Last Vitals:  Vitals Value Taken Time  BP 90/61 06/05/21 1510  Temp 36.8 C 06/05/21 1510  Pulse 75 06/05/21 1514  Resp 15 06/05/21 1514  SpO2 96 % 06/05/21 1514  Vitals shown include unvalidated device data.  Last Pain:  Vitals:   06/05/21 0638  TempSrc: Oral  PainSc: 0-No pain      Patients Stated Pain Goal: 3 (11/09/81 5003)  Complications: No notable events documented.

## 2021-06-05 NOTE — Op Note (Signed)
06/05/2021  2:22 PM  PATIENT:  Angelica Morgan  55 y.o. 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  See anesthesia record  Delay start of Pharmacological VTE agent (>24hrs) due to surgical blood loss or risk of bleeding:  no  DRAINS:  19 Fr Blake drain     SPECIMEN:  No Specimen  DISPOSITION OF SPECIMEN:  N/A  COUNTS:  YES  PLAN OF CARE:  Intubated and returned to urology primary service for completion of the case  PATIENT DISPOSITION: Intubated in the operating room 5.  INDICATION: 55 year old woman found to have a significant right renal mass 12 cm.  Suspicious for malignancy.  Seen by Dr. Louis Morgan with [facility] who recommended resection.  Laparoscopic resection underway.  Densely adherent to the duodenal pancreatic region.  In freeing the specimen off a dot of bile leaking and evidence of serosal dehiscence on the duodenum noted.  Intra-Op consultation requested.  OR FINDINGS: Pinhole duodenal opening and contiguous serosal dehiscence involving the antimesenteric anterior duodenum involving D2.  Repair done transversely with omentopexy.  CASE DATA:  Type of patient?: Elective [facility] Private Case  Status of Case? Elective Scheduled  Infection Present At Time Of Surgery (PATOS)?  NO  DESCRIPTION: I came into the room and the patient was already intubated sedated.  Dr. Louis Morgan and Angelica Morgan had already completed nephrectomy and had the specimen in a bag in the abdomen.  This had been done through a  laparoscopic assisted resection.  They have placed some clips in the retroperitoneum just inferior to the duodenal pancreatic region and had assured hemostasis on a retroperitoneum & elsewhere.  IVC clean.    I scrubbed in and confirmed a pinhole opening in the anterior duodenum around D2 along with some serosal disruption.  We did careful irrigation and ensure hemostasis was good.  Saw no other obvious abnormalities in the abdominal compartment.  I primarily closed this region transversely using 2-0 V-lock using laparoscopic intracorporeal suturing.  I started on the lateral antimesenteric duodenal side and ran toward the midline.  I then ran another stitch from the medial corner and ran to the midline.  That helped imbricate and close the duodenal opening as well as close to protect the serosal area without any luminal narrowing.  This brought things together well.    We asked anesthesia to place a nasogastric tube.  They insufflated the stomach while I clamped the duodenum distally.  Region was held under water with irrigation.   The duodenum distended well.  There was no evidence of any stricturing or narrowing across the repair. There was no leaking of air consistent with an airtight seal and a negative air leak test.  Tissues looked healthy and viable.  Because it was the duodenum ,I decided be wise to have omentum to help protect the repair.  I laid omentum over the repair and brought the V-Lock sutures up through that and back down to provide a nice OMENTOPEXY patch over the repair and closure.  We did irrigation and hemostasis was good.  I  returned the case back to Dr. Louis Morgan and his team.  Please see his operative note for details.  I did update Dr. Marlou Morgan who is on the emergency surgery LDOW service.  Our team will be available to help follow with the patient.  I think it is reasonable to leave a surgical drain and advance diet as tolerated.  Should the patient do well can remove the drain in the  next day or so.  If concerns then check drain amylase and upper GI.  Hopefully not too likely.    Angelica Morgan, M.D., F.A.C.S. Gastrointestinal and Minimally Invasive Surgery [facility] [address] [place], [place] [address] [phone] Main / Paging

## 2021-06-05 NOTE — Anesthesia Procedure Notes (Signed)
Procedure Name: Intubation Date/Time: 06/05/2021 8:34 AM Performed by: Rosaland Lao, CRNA Pre-anesthesia Checklist: Patient identified, Emergency Drugs available, Suction available and Patient being monitored Patient Re-evaluated:Patient Re-evaluated prior to induction Oxygen Delivery Method: Circle system utilized Preoxygenation: Pre-oxygenation with 100% oxygen Induction Type: IV induction and Rapid sequence Laryngoscope Size: Miller and 2 Grade View: Grade I Tube type: Oral Tube size: 7.5 mm Number of attempts: 1 Airway Equipment and Method: Stylet Placement Confirmation: ETT inserted through vocal cords under direct vision, positive ETCO2 and breath sounds checked- equal and bilateral Secured at: 22 cm Tube secured with: Tape Dental Injury: Teeth and Oropharynx as per pre-operative assessment

## 2021-06-05 NOTE — Op Note (Signed)
Preoperative diagnosis:  Right renal mass   Postoperative diagnosis:  same   Procedure: Laparoscopic right radical nephrectomy  Surgeon: Ardis Hughs, MD Resident Assistant: Anette Riedel   Anesthesia: General  Complications: small duodenal enterotomy - duodenum adherent to kidney near the hilum.  Closed by Dr. Neysa Bonito, general surgery.  Intraoperative findings: Very small pneumo-peritoneum with large mass creating very limited space to manipulate the surrounding structures, lift the kidney.  Proximal duodenum adherent to the renal hilum.  Numerous parasitic vessels feeding into renal mass.  Mass lobulated on inferior-medial aspect.  Unexpected bleeding from gonadal vein, controlled with hemolock clips.  Right adrenal gland was spared.  EBL: 2L Drain: 56F blake drain in right lower quadrant. Transfusion - 3 units PRBC, albumin infusion  Specimens:  Right kidney and proximal ureter  Indication: Angelica Morgan is a 55 y.o. patient with large right renal mass.  After reviewing the management options for treatment, he elected to proceed with the above surgical procedure(s). We have discussed the potential benefits and risks of the procedure, side effects of the proposed treatment, the likelihood of the patient achieving the goals of the procedure, and any potential problems that might occur during the procedure or recuperation. Informed consent has been obtained.  Description of procedure:  A site was selected lateral to the umbilicus for placement of the camera port. This was placed using a standard open Hassan technique which allowed entry into the peritoneal cavity under direct vision and without difficulty. A 12 mm Hassan cannula was placed and a pneumoperitoneum established. The camera was then used to inspect the abdomen and there was no evidence of any intra-abdominal injuries or other abnormalities. The remaining abdominal ports were then placed under visual guidance.  A  second 12 mm port was placed in the right lower quadrant approximately 8 cm away from the camera. A 5 mm port was placed in the right upper quadrant again 8 cm away from the camera. A second 5 mm port was placed just inferior to the xiphoid process in the midline, this was used as a liver retractor.  An additional 5 mm port was then placed in the right lower quadrant at the anterior axillary line lateral to the 12 mm port. All ports were placed under direct vision without difficulty.  The mesentery was adherent to the anterior abdominal wall - taking down this resulted in mobilization on the kidney medial making it harder to get colon off of the kidney.   The white line of Toldt was incised allowing the colon to be mobilized medially and the plane between the mesocolon and the anterior layer of Gerota's fascia to be developed and the kidney exposed. The ureter and gonadal vein were identified inferiorly and the ureter was lifted anteriorly off the psoas muscle. Dissection proceeded superiorly along the gonadal vein until the renal vein was identified. The renal hilum was then carefully isolated with a combination of blunt and sharp dissectiong allowing the renal arterial and venous structures to be separated and isolated. The kidney was difficult to mobilize making hilar access challenging.  The duodenum was also adherent renal hilum and was difficult to take off the vascular structures. In the process we did create a small enterotomy.  This was recognized and general surgery consulted intraoperative for repair.  Once the hilum was reached I opetd to take both artery and vein at the same time.  ligated and divided with a 45 mm Flex ETS stapler.    Gerota's fascia  was intentionally entered superiorly and the space between the adrenal gland and the kidney was developed allowing the adrenal gland to be spared. The hepatorenal ligaments were divided.. The lateral and posterior attachements to the kidney were then  divided. Once the kidney was free from its attachments  40cc of 0.25% rupivocaine was then injected into the right anterior axillary line b/w the iliac crest and the twelfth rib under laparoscopic guidance. The layer between the tranversus abdominus and the internal oblique was targeted.   The kidney/ureter specimen was then placed into a 15 mm Endocatch II retrieval bag. The renal hilum, liver, adrenal bed and gonadal vein areas were each inspected and hemostasis was ensured with the pneomperitoneal pressures lowered. Surgicel was then placed over the hilum.  The camera was then brought to the 12 mm port laterally and the camera port was removed and the fascia closed using a [place] needle with a 0 Vicryl.  All the ports were then removed under visual guidance. The lateral 12 mm port and 5 mm port were then connected sharply with a 15 blade. Then opened this incision down to the external oblique fascia. We then spread the muscle fibers down to the internal oblique fascia which we then opened with cautery. These were then spread in all muscle spared and the posterior peritoneum was opened. The rectus muscle was pulled medially. The specimen was then removed through these incision. The internal oblique fascia was then closed with a 0 Vicryl in a running fashion. The external oblique fascia was then closed with a 0 PDS in a running fashion.  All incisions were injected with local anesthetic and reapproximated at the skin with 4-0 monocryl sutures. Dermabond was applied to the skin. The patient tolerated the procedure well and without complications and was transferred to the recovery unit in satisfactory condition.   Ardis Hughs, M.D.

## 2021-06-05 NOTE — Discharge Instructions (Signed)
Activity:  You are encouraged to ambulate frequently (about every hour during waking hours) to help prevent blood clots from forming in your legs or lungs.  However, you should not engage in any heavy lifting (> 10-15 lbs), strenuous activity, or straining. Diet: You should advance your diet as instructed by your physician.  It will be normal to have some bloating, nausea, and abdominal discomfort intermittently. Prescriptions:  You will be provided a prescription for pain medication to take as needed.  If your pain is not severe enough to require the prescription pain medication, you may take extra strength Tylenol instead which will have less side effects.  You should also take a prescribed stool softener to avoid straining with bowel movements as the prescription pain medication may constipate you. Incisions: You may remove your dressing bandages 48 hours after surgery if not removed in the hospital.  You will either have some small staples or special tissue glue at each of the incision sites. Once the bandages are removed (if present), the incisions may stay open to air.  You may start showering (but not soaking or bathing in water) the 2nd day after surgery and the incisions simply need to be patted dry after the shower.  No additional care is needed. What to call us about: You should call the office [phone]) if you develop fever > 101 or develop persistent vomiting.   Minimally Invasive Nephrectomy, Care After This sheet gives you information about how to care for yourself after your procedure. Your health care provider may also give you more specific instructions. If you have problems or questions, contact your health careprovider. What can I expect after the procedure? After the procedure, it is common to have: Pain. Soreness and numbness in the area around your incisions. Follow these instructions at home: Medicines  Take over-the-counter and prescription medicines only as told by  your health care provider. Avoid using NSAIDs regularly over a long period of time. These include aspirin and ibuprofen. Doing so may damage your remaining kidney. Ask your health care provider if the medicine prescribed to you: Requires you to avoid driving or using machinery. Can cause constipation. You may need to take these actions to prevent or treat constipation: Drink enough fluid to keep your urine pale yellow. Take over-the-counter or prescription medicines. Eat foods that are high in fiber, such as beans, whole grains, and fresh fruits and vegetables. Limit foods that are high in fat and processed sugars, such as fried or sweet foods.   Incision care and drain care  Follow instructions from your health care provider about how to take care of your incisions. Make sure you: Wash your hands with soap and water for at least 20 seconds before and after you change your bandage (dressing). If soap and water are not available, use hand sanitizer. Change your dressing as told by your health care provider. Leave stitches (sutures), skin glue, or adhesive strips in place. These skin closures may need to be in place for 2 weeks or longer. If adhesive strip edges start to loosen and curl up, you may trim the loose edges. Do not remove adhesive strips completely unless your health care provider tells you to do that. Check your incision area every day for signs of infection. Check for: Redness, swelling, or more pain. Fluid or blood. Warmth. Pus or a bad smell. If you have a drain in place, follow your health care provider's instructions for drain care. This may include emptying the drain and  keeping track of the amount of drainage.   Activity  Rest as told by your health care provider. Avoid sitting for a long time without moving. Get up to take short walks every 1-2 hours. This is important to improve blood flow and breathing. Ask for help if you feel weak or unsteady. Do not lift anything  that is heavier than 10 lb (4.5 kg), or the limit that you are told, until your health care provider says that it is safe. Do not play contact sports. Doing so may damage your remaining kidney. Return to your normal activities as told by your health care provider. Ask your health care provider what activities are safe for you.   Catheter care If you have a urinary catheter, care for it as told by your health care provider. You may be told to: Wash your hands with soap and water for at least 20 seconds before and after touching the catheter, tubing, or drainage bag. If soap and water are not available, use hand sanitizer. Empty the drainage bag every 2-4 hours, or more often if needed. Do not let the bag get completely full. Monitor the amount and color of your urine as told by your health care provider. Keep the area around the catheter clean and dry. Make sure to keep the catheter secured to avoid pulling and accidental removal. Always keep the urine collection bag below the level of your bladder. Check the catheter tubing regularly to make sure there are no kinks or blockages. Make sure that the catheter is not placed under water. General instructions Do not take baths, swim, or use a hot tub until your health care provider approves. Ask your health care provider if you may take showers. You may only be allowed to take sponge baths. Do deep breathing exercises as told by your health care provider. These help to prevent lung infection (pneumonia). Wear compression stockings as told by your health care provider. These stockings help to prevent blood clots and reduce swelling in your legs. Keep all follow-up visits. This is important. Contact a health care provider if: You have a fever or chills. You have pain that is not controlled by your pain medicine. You have redness, swelling, or more pain at an incision site. You have a cough. An incision is warm to the touch. You have blood in your  urine. You have not had a bowel movement in 3 days. You have diarrhea. Get help right away if: You have trouble breathing or feel short of breath. You have chest pain. You have severe pain. There is fluid or blood coming from an incision. There is pus or a bad smell coming from an incision. You cannot urinate. You have warmth, redness, and tenderness in your leg. These symptoms may represent a serious problem that is an emergency. Do not wait to see if the symptoms will go away. Get medical help right away. Call your local emergency services (911 in the [place] Do not drive yourself to the hospital. Summary Follow instructions from your health care provider about how to take care of your incisions. Check your incision area every day for signs of infection. Take over-the-counter and prescription medicines only as told by your health care provider. Return to your normal activities as told by your health care provider. Ask your health care provider what activities are safe for you. Keep all follow-up visits. This is important.

## 2021-06-06 ENCOUNTER — Encounter (HOSPITAL_COMMUNITY): Payer: Self-pay | Admitting: Urology

## 2021-06-06 LAB — CBC
HCT: 32.5 % — ABNORMAL LOW (ref 36.0–46.0)
Hemoglobin: 10.6 g/dL — ABNORMAL LOW (ref 12.0–15.0)
MCH: 27.9 pg (ref 26.0–34.0)
MCHC: 32.6 g/dL (ref 30.0–36.0)
MCV: 85.5 fL (ref 80.0–100.0)
Platelets: 150 10*3/uL (ref 150–400)
RBC: 3.8 MIL/uL — ABNORMAL LOW (ref 3.87–5.11)
RDW: 14.5 % (ref 11.5–15.5)
WBC: 9.3 10*3/uL (ref 4.0–10.5)
nRBC: 0 % (ref 0.0–0.2)

## 2021-06-06 LAB — BASIC METABOLIC PANEL
Anion gap: 12 (ref 5–15)
BUN: 14 mg/dL (ref 6–20)
CO2: 26 mmol/L (ref 22–32)
Calcium: 8.5 mg/dL — ABNORMAL LOW (ref 8.9–10.3)
Chloride: 105 mmol/L (ref 98–111)
Creatinine, Ser: 1.2 mg/dL — ABNORMAL HIGH (ref 0.44–1.00)
GFR, Estimated: 53 mL/min — ABNORMAL LOW (ref >60)
Glucose, Bld: 131 mg/dL — ABNORMAL HIGH (ref 70–99)
Potassium: 4.3 mmol/L (ref 3.5–5.1)
Sodium: 143 mmol/L (ref 135–145)

## 2021-06-06 MED ORDER — ACETAMINOPHEN 500 MG PO TABS: 1000.0000 mg | ORAL_TABLET | Freq: Four times a day (QID) | ORAL | 0 refills | Status: AC | PRN

## 2021-06-06 MED ORDER — CHLORHEXIDINE GLUCONATE CLOTH 2 % EX PADS: 6.0000 | MEDICATED_PAD | Freq: Every day | CUTANEOUS | Status: DC

## 2021-06-06 MED ORDER — TRAMADOL HCL 50 MG PO TABS: 50.0000 mg | ORAL_TABLET | Freq: Four times a day (QID) | ORAL | 0 refills | Status: DC | PRN

## 2021-06-06 MED ORDER — ESOMEPRAZOLE MAGNESIUM 40 MG PO CPDR: 40.0000 mg | DELAYED_RELEASE_CAPSULE | Freq: Every day | ORAL | 0 refills | Status: DC

## 2021-06-06 MED ORDER — DOCUSATE SODIUM 100 MG PO CAPS: 100.0000 mg | ORAL_CAPSULE | Freq: Two times a day (BID) | ORAL | 0 refills | Status: AC

## 2021-06-06 MED ORDER — PANTOPRAZOLE SODIUM 40 MG PO TBEC: 40.0000 mg | DELAYED_RELEASE_TABLET | Freq: Two times a day (BID) | ORAL | Status: DC

## 2021-06-06 NOTE — Progress Notes (Signed)
    1 Day Post-Op  Subjective: CC: Patient reports that her pain is well controlled. JP drain was reported to be SS and was removed this am. She is tolerating cld without n/v.   Objective: Vital signs in last 24 hours: Temp:  [97.6 F (36.4 C)-98.4 F (36.9 C)] 98.2 F (36.8 C) (07/21 0910) Pulse Rate:  [58-73] 71 (07/21 0910) Resp:  [12-20] 18 (07/21 0910) BP: (85-108)/(47-87) 95/53 (07/21 0910) SpO2:  [90 %-99 %] 94 % (07/21 0910)    Intake/Output from previous day: 07/20 0701 - 07/21 0700 In: 6175.8 [I.V.:3978.9; Blood:945; IV Piggyback:1251.9] Out: 6962 [Urine:1725; Drains:90; Blood:2000] Intake/Output this shift: No intake/output data recorded.  PE: Abd: Soft, ND, appropriately tender around incision, otherwise nt. No peritonitis. JP drain site bandage c/d/I. Incision c/d/I.  Lab Results:  Recent Labs    06/05/21 1527 06/06/21 0436  WBC  --  9.3  HGB 11.9* 10.6*  HCT 37.5 32.5*  PLT  --  150   BMET Recent Labs    06/05/21 1224 06/06/21 0436  NA 139 143  K 3.9 4.3  CL  --  105  CO2  --  26  GLUCOSE  --  131*  BUN  --  14  CREATININE  --  1.20*  CALCIUM  --  8.5*   PT/INR No results for input(s): LABPROT, INR in the last 72 hours. CMP     Component Value Date/Time   NA 143 06/06/2021 0436   K 4.3 06/06/2021 0436   CL 105 06/06/2021 0436   CO2 26 06/06/2021 0436   GLUCOSE 131 (H) 06/06/2021 0436   BUN 14 06/06/2021 0436   CREATININE 1.20 (H) 06/06/2021 0436   CALCIUM 8.5 (L) 06/06/2021 0436   PROT 7.7 06/03/2021 0855   ALBUMIN 4.4 06/03/2021 0855   AST 23 06/03/2021 0855   ALT 22 06/03/2021 0855   ALKPHOS 58 06/03/2021 0855   BILITOT 0.8 06/03/2021 0855   GFRNONAA 53 (L) 06/06/2021 0436   GFRAA >60 02/03/2019 1257   Lipase  No results found for: LIPASE  Studies/Results: No results found.  Anti-infectives: Anti-infectives (From admission, onward)    Start     Dose/Rate Route Frequency Ordered Stop   06/05/21 1630  clindamycin  (CLEOCIN) IVPB 600 mg        600 mg 100 mL/hr over 30 Minutes Intravenous Every 8 hours 06/05/21 1443     06/05/21 0645  clindamycin (CLEOCIN) IVPB 900 mg        900 mg 100 mL/hr over 30 Minutes Intravenous 30 min pre-op 06/05/21 0631 06/05/21 0819        Assessment/Plan POD 1 s/p DUODENAL REPAIR WITH OMENTOPEXY by Dr. 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 Was reported to be SS - Tolerating cld. Can adv per primary - Recommend PPI BID  - Foley per primary  - VTE prophylaxis per primary team - Discharge planning per primary    LOS: 1 day    Jillyn Ledger , [facility] Surgery 06/06/2021, 10:09 AM Please see Amion for pager number during day hours 7:00am-4:30pm

## 2021-06-06 NOTE — Anesthesia Postprocedure Evaluation (Signed)
Anesthesia Post Note  Patient: Nomie Buchberger  Procedure(s) Performed: LAPAROSCOPIC  RADICAL NEPHRECTOMY WITH DUDODENAL REPAIR WITH OMENTOPEXY (Right)     Patient location during evaluation: PACU Anesthesia Type: General Level of consciousness: awake and alert Pain management: pain level controlled Vital Signs Assessment: post-procedure vital signs reviewed and stable Respiratory status: spontaneous breathing, nonlabored ventilation, respiratory function stable and patient connected to nasal cannula oxygen Cardiovascular status: blood pressure returned to baseline and stable Postop Assessment: no apparent nausea or vomiting Anesthetic complications: no   No notable events documented.  Last Vitals:  Vitals:   06/06/21 0910 06/06/21 1226  BP: (!) 95/53 (!) 93/53  Pulse: 71 68  Resp: 18 17  Temp: 36.8 C 36.5 C  SpO2: 94% 98%    Last Pain:  Vitals:   06/06/21 1226  TempSrc: Oral  PainSc:                  Kassidie Hendriks S

## 2021-06-06 NOTE — Discharge Summary (Addendum)
[facility] Discharge Summary  Admit date: 06/05/2021  Discharge date and time: 06/06/21   Discharge to: Home  Discharge Service: Urology  Discharge Attending Physician:  Louis Meckel, MD  Discharge  Diagnoses: Renal Mass  Secondary Diagnosis: Active Problems:   Renal mass   OR Procedures: Procedure(s): [place] OMENTOPEXY 06/05/2021   Ancillary Procedures: None   Discharge Day Services: The patient was seen and examined by the Urology team both in the morning and immediately prior to discharge.  Vital signs and laboratory values were stable and within normal limits.  The physical exam was benign and unchanged and all surgical wounds were examined.  Discharge instructions were explained and all questions answered.  Subjective  No acute events overnight. Pain Controlled. No fever or chills.  Objective No data found.  Total I/O In: 1230.8 [I.V.:978.9; IV Piggyback:251.9] Out: 1310 [Urine:1200; Drains:110]  General Appearance:        No acute distress Lungs:                       Normal work of breathing on room air Heart:                                Regular rate and rhythm Abdomen:                         Soft, non-tender, non-distended Extremities:                      Warm and well perfused   Hospital Course:  The patient underwent Laparoscopic Right Radical Nephrectomy (Urology) and repair of duodenal opening (general surgery) on 06/05/2021.  The patient tolerated the procedure well, was extubated in the OR, and afterwards was taken to the PACU for routine post-surgical care. When stable the patient was transferred to the floor.   The patient did well postoperatively.  The patient's diet was slowly advanced and at the time of discharge was tolerating a regular diet.  The patient was discharged home  2 Days Post-Op, at which point was tolerating a regular solid diet, was able to void spontaneously, have adequate  pain control with P.O. pain medication, and could ambulate without difficulty. The patient will follow up with [place] for post op check.   Condition at Discharge: Improved  Discharge Medications:  Allergies as of 06/06/2021       Reactions   Anesthetic [benzocaine] Nausea And Vomiting   Penicillins Diarrhea   Stomach cramps        Medication List     STOP taking these medications    traZODone 50 MG tablet Commonly known as: DESYREL       TAKE these medications    acetaminophen 500 MG tablet Commonly known as: TYLENOL Take 2 tablets (1,000 mg total) by mouth every 6 (six) hours as needed for up to 7 days.   ALPRAZolam 0.5 MG tablet Commonly known as: XANAX Take 0.5 mg by mouth in the morning and at bedtime.   azelastine 0.05 % ophthalmic solution Commonly known as: OPTIVAR Place 1 drop into both eyes daily as needed for allergies.   Belsomra 20 MG Tabs Generic drug: Suvorexant Take 20 mg by mouth at bedtime.   bimatoprost 0.03 % ophthalmic solution Commonly known as: LATISSE Place 1 application into both eyes at bedtime.   cetirizine  10 MG tablet Commonly known as: ZYRTEC Take 10 mg by mouth daily.   CRANBERRY PO Take 1 tablet by mouth daily.   cyclobenzaprine 10 MG tablet Commonly known as: FLEXERIL Take 10 mg by mouth at bedtime as needed for muscle spasms.   docusate sodium 100 MG capsule Commonly known as: Colace Take 1 capsule (100 mg total) by mouth 2 (two) times daily for 14 days.   esomeprazole 40 MG capsule Commonly known as: NexIUM Take 1 capsule (40 mg total) by mouth daily.   estradiol 0.1 MG/GM vaginal cream Commonly known as: ESTRACE Place 1 Applicatorful vaginally daily as needed (irritation).   FISH OIL PO Take 1 capsule by mouth daily.   fluticasone 50 MCG/ACT nasal spray Commonly known as: FLONASE Place 1 spray into both nostrils daily as needed for allergies.   gabapentin 300 MG capsule Commonly known as: NEURONTIN Take 900  mg by mouth at bedtime.   GLUCOSAMINE-CHONDROITIN PO Take 1 tablet by mouth daily.   hydrochlorothiazide 25 MG tablet Commonly known as: HYDRODIURIL Take 25 mg by mouth daily.   multivitamin with minerals Tabs tablet Take 1 tablet by mouth daily.   traMADol 50 MG tablet Commonly known as: ULTRAM Take 1-2 tablets (50-100 mg total) by mouth every 6 (six) hours as needed for moderate pain.   tretinoin 0.05 % cream Commonly known as: RETIN-A Apply 1 application topically at bedtime as needed (blemishes).   Vilazodone HCl 40 MG Tabs Commonly known as: VIIBRYD Take 40 mg by mouth in the morning.       ASK your doctor about these medications    DULoxetine 60 MG capsule Commonly known as: CYMBALTA Take 1 capsule (60 mg total) by mouth 2 (two) times daily.

## 2021-06-06 NOTE — Progress Notes (Addendum)
Urology Progress Note   1 Day Post-Op from Laparoscopic Right Radical Nephrectomy (Urology) and repair of duodenal opening (general surgery) on 06/05/21.   Subjective: NAEON. Ambulatedin halls. VSS. Made 1.7Lof urine. 90cc form JP drain, 45 since midnight. Hemoglobin stable at 10.6 rfom 11/9. Creatinine is 1.2 from 0.7 pre-op. JP is sanguinous  Objective: Vital signs in last 24 hours: Temp:  [97.6 F (36.4 C)-98.4 F (36.9 C)] 98.4 F (36.9 C) (07/21 0501) Pulse Rate:  [58-73] 68 (07/21 0501) Resp:  [12-20] 18 (07/21 0501) BP: (85-108)/(47-87) 108/59 (07/21 0501) SpO2:  [90 %-99 %] 91 % (07/21 0501)  Intake/Output from previous day: 07/20 0701 - 07/21 0700 In: 6175.8 [I.V.:3978.9; Blood:945; IV Piggyback:1251.9] Out: 8309 [Urine:1725; Drains:90; Blood:2000] Intake/Output this shift: Total I/O In: 1230.8 [I.V.:978.9; IV Piggyback:251.9] Out: 4076 [Urine:1200; Drains:65]  Physical Exam:  General: Alert and oriented CV: Regular rate Lungs: No increased work of breathing Abdomen: Soft, appropriately tender. Incisions c/d/i. JP sanguinous GU: Foley in place draining clear yellow urine  Ext: NT, No erythema  Lab Results: Recent Labs    06/05/21 1224 06/05/21 1527 06/06/21 0436  HGB 11.6* 11.9* 10.6*  HCT 34.0* 37.5 32.5*   Recent Labs    06/03/21 0855 06/05/21 1224 06/06/21 0436  NA 138 139 143  K 4.3 3.9 4.3  CL 102  --  105  CO2 27  --  26  GLUCOSE 117*  --  131*  BUN 18  --  14  CREATININE 0.67  --  1.20*  CALCIUM 9.4  --  8.5*    Studies/Results: No results found.  Assessment/Plan:  55 y.o. female s/p Laparoscopic Right Radical Nephrectomy (Urology) and repair of duodenal opening (general surgery). Overall doing well post-op.   - Discontinue IV fluids - prn PO analgesics - trial of void - Advance diet as tolerated - JP drain per gen surg  Dispo: Home today vs tomorrow   LOS: 1 day

## 2021-06-08 DIAGNOSIS — F4321 Adjustment disorder with depressed mood: Secondary | ICD-10-CM | POA: Diagnosis not present

## 2021-06-08 DIAGNOSIS — G4700 Insomnia, unspecified: Secondary | ICD-10-CM | POA: Diagnosis not present

## 2021-06-08 DIAGNOSIS — F3342 Major depressive disorder, recurrent, in full remission: Secondary | ICD-10-CM | POA: Diagnosis not present

## 2021-06-08 DIAGNOSIS — F4322 Adjustment disorder with anxiety: Secondary | ICD-10-CM | POA: Diagnosis not present

## 2021-06-09 LAB — TYPE AND SCREEN
ABO/RH(D): B POS
Antibody Screen: NEGATIVE
Unit division: 0
Unit division: 0
Unit division: 0
Unit division: 0

## 2021-06-09 LAB — BPAM RBC
Blood Product Expiration Date: 202208092359
Blood Product Expiration Date: 202208102359
Blood Product Expiration Date: 202208102359
Blood Product Expiration Date: 202208102359
ISSUE DATE / TIME: 202207201203
ISSUE DATE / TIME: 202207201203
ISSUE DATE / TIME: 202207201254
ISSUE DATE / TIME: 202207201254
Unit Type and Rh: 7300
Unit Type and Rh: 7300
Unit Type and Rh: 7300
Unit Type and Rh: 7300

## 2021-06-10 DIAGNOSIS — F411 Generalized anxiety disorder: Secondary | ICD-10-CM | POA: Diagnosis not present

## 2021-06-10 LAB — SURGICAL PATHOLOGY

## 2021-06-19 DIAGNOSIS — C641 Malignant neoplasm of right kidney, except renal pelvis: Secondary | ICD-10-CM | POA: Diagnosis not present

## 2021-06-20 DIAGNOSIS — F411 Generalized anxiety disorder: Secondary | ICD-10-CM | POA: Diagnosis not present

## 2021-07-03 DIAGNOSIS — M8540 Solitary bone cyst, unspecified site: Secondary | ICD-10-CM | POA: Diagnosis not present

## 2021-07-03 DIAGNOSIS — M8580 Other specified disorders of bone density and structure, unspecified site: Secondary | ICD-10-CM | POA: Diagnosis not present

## 2021-07-04 DIAGNOSIS — G4700 Insomnia, unspecified: Secondary | ICD-10-CM | POA: Diagnosis not present

## 2021-07-04 DIAGNOSIS — F3342 Major depressive disorder, recurrent, in full remission: Secondary | ICD-10-CM | POA: Diagnosis not present

## 2021-07-12 DIAGNOSIS — D49511 Neoplasm of unspecified behavior of right kidney: Secondary | ICD-10-CM | POA: Diagnosis not present

## 2021-07-15 DIAGNOSIS — N390 Urinary tract infection, site not specified: Secondary | ICD-10-CM | POA: Diagnosis not present

## 2021-07-17 DIAGNOSIS — F411 Generalized anxiety disorder: Secondary | ICD-10-CM | POA: Diagnosis not present

## 2021-07-25 DIAGNOSIS — K219 Gastro-esophageal reflux disease without esophagitis: Secondary | ICD-10-CM | POA: Diagnosis not present

## 2021-07-25 DIAGNOSIS — R42 Dizziness and giddiness: Secondary | ICD-10-CM | POA: Diagnosis not present

## 2021-08-15 DIAGNOSIS — F411 Generalized anxiety disorder: Secondary | ICD-10-CM | POA: Diagnosis not present

## 2021-08-29 DIAGNOSIS — E611 Iron deficiency: Secondary | ICD-10-CM | POA: Diagnosis not present

## 2021-08-29 DIAGNOSIS — C641 Malignant neoplasm of right kidney, except renal pelvis: Secondary | ICD-10-CM | POA: Diagnosis not present

## 2021-08-30 DIAGNOSIS — Z1211 Encounter for screening for malignant neoplasm of colon: Secondary | ICD-10-CM | POA: Diagnosis not present

## 2021-09-03 DIAGNOSIS — F411 Generalized anxiety disorder: Secondary | ICD-10-CM | POA: Diagnosis not present

## 2021-09-13 DIAGNOSIS — C641 Malignant neoplasm of right kidney, except renal pelvis: Secondary | ICD-10-CM | POA: Diagnosis not present

## 2021-09-13 DIAGNOSIS — D49511 Neoplasm of unspecified behavior of right kidney: Secondary | ICD-10-CM | POA: Diagnosis not present

## 2021-09-19 DIAGNOSIS — C641 Malignant neoplasm of right kidney, except renal pelvis: Secondary | ICD-10-CM | POA: Diagnosis not present

## 2021-09-20 DIAGNOSIS — F411 Generalized anxiety disorder: Secondary | ICD-10-CM | POA: Diagnosis not present

## 2021-09-30 DIAGNOSIS — Z6837 Body mass index (BMI) 37.0-37.9, adult: Secondary | ICD-10-CM | POA: Diagnosis not present

## 2021-09-30 DIAGNOSIS — Z1231 Encounter for screening mammogram for malignant neoplasm of breast: Secondary | ICD-10-CM | POA: Diagnosis not present

## 2021-09-30 DIAGNOSIS — Z01419 Encounter for gynecological examination (general) (routine) without abnormal findings: Secondary | ICD-10-CM | POA: Diagnosis not present

## 2021-10-03 DIAGNOSIS — G4700 Insomnia, unspecified: Secondary | ICD-10-CM | POA: Diagnosis not present

## 2021-10-03 DIAGNOSIS — F3342 Major depressive disorder, recurrent, in full remission: Secondary | ICD-10-CM | POA: Diagnosis not present

## 2021-10-29 ENCOUNTER — Other Ambulatory Visit: Payer: Self-pay | Admitting: Obstetrics and Gynecology

## 2021-10-29 DIAGNOSIS — Z803 Family history of malignant neoplasm of breast: Secondary | ICD-10-CM

## 2021-11-19 ENCOUNTER — Other Ambulatory Visit: Payer: BC Managed Care – PPO

## 2021-11-21 ENCOUNTER — Inpatient Hospital Stay: Admission: RE | Admit: 2021-11-21 | Payer: BC Managed Care – PPO | Source: Ambulatory Visit

## 2021-11-22 DIAGNOSIS — F411 Generalized anxiety disorder: Secondary | ICD-10-CM | POA: Diagnosis not present

## 2021-12-02 DIAGNOSIS — M19072 Primary osteoarthritis, left ankle and foot: Secondary | ICD-10-CM | POA: Diagnosis not present

## 2021-12-02 DIAGNOSIS — M2141 Flat foot [pes planus] (acquired), right foot: Secondary | ICD-10-CM | POA: Diagnosis not present

## 2021-12-02 DIAGNOSIS — M19071 Primary osteoarthritis, right ankle and foot: Secondary | ICD-10-CM | POA: Diagnosis not present

## 2021-12-03 ENCOUNTER — Ambulatory Visit
Admission: RE | Admit: 2021-12-03 | Discharge: 2021-12-03 | Disposition: A | Discharge status: Home / Self Care | Payer: BC Managed Care – PPO | Source: Ambulatory Visit | Attending: Obstetrics and Gynecology | Admitting: Obstetrics and Gynecology

## 2021-12-03 ENCOUNTER — Other Ambulatory Visit: Payer: Self-pay

## 2021-12-03 DIAGNOSIS — Z803 Family history of malignant neoplasm of breast: Secondary | ICD-10-CM

## 2021-12-03 DIAGNOSIS — N6489 Other specified disorders of breast: Secondary | ICD-10-CM | POA: Diagnosis not present

## 2021-12-03 MED ORDER — GADOBUTROL 1 MMOL/ML IV SOLN
10.0000 mL | Freq: Once | INTRAVENOUS | Status: AC | PRN
  Administered 2021-12-03: 10 mL via INTRAVENOUS

## 2021-12-06 DIAGNOSIS — F3342 Major depressive disorder, recurrent, in full remission: Secondary | ICD-10-CM | POA: Diagnosis not present

## 2021-12-06 DIAGNOSIS — F411 Generalized anxiety disorder: Secondary | ICD-10-CM | POA: Diagnosis not present

## 2021-12-06 DIAGNOSIS — F5101 Primary insomnia: Secondary | ICD-10-CM | POA: Diagnosis not present

## 2021-12-10 DIAGNOSIS — F411 Generalized anxiety disorder: Secondary | ICD-10-CM | POA: Diagnosis not present

## 2022-01-02 DIAGNOSIS — F411 Generalized anxiety disorder: Secondary | ICD-10-CM | POA: Diagnosis not present

## 2022-01-16 DIAGNOSIS — F331 Major depressive disorder, recurrent, moderate: Secondary | ICD-10-CM | POA: Diagnosis not present

## 2022-01-16 DIAGNOSIS — F411 Generalized anxiety disorder: Secondary | ICD-10-CM | POA: Diagnosis not present

## 2022-01-23 DIAGNOSIS — F411 Generalized anxiety disorder: Secondary | ICD-10-CM | POA: Diagnosis not present

## 2022-01-23 DIAGNOSIS — F331 Major depressive disorder, recurrent, moderate: Secondary | ICD-10-CM | POA: Diagnosis not present

## 2022-01-28 DIAGNOSIS — F411 Generalized anxiety disorder: Secondary | ICD-10-CM | POA: Diagnosis not present

## 2022-01-28 DIAGNOSIS — F331 Major depressive disorder, recurrent, moderate: Secondary | ICD-10-CM | POA: Diagnosis not present

## 2022-02-11 DIAGNOSIS — F411 Generalized anxiety disorder: Secondary | ICD-10-CM | POA: Diagnosis not present

## 2022-02-11 DIAGNOSIS — F331 Major depressive disorder, recurrent, moderate: Secondary | ICD-10-CM | POA: Diagnosis not present

## 2022-03-04 DIAGNOSIS — F411 Generalized anxiety disorder: Secondary | ICD-10-CM | POA: Diagnosis not present

## 2022-03-04 DIAGNOSIS — F331 Major depressive disorder, recurrent, moderate: Secondary | ICD-10-CM | POA: Diagnosis not present

## 2022-03-17 DIAGNOSIS — Z9889 Other specified postprocedural states: Secondary | ICD-10-CM | POA: Diagnosis not present

## 2022-03-17 DIAGNOSIS — D49511 Neoplasm of unspecified behavior of right kidney: Secondary | ICD-10-CM | POA: Diagnosis not present

## 2022-03-17 DIAGNOSIS — Z905 Acquired absence of kidney: Secondary | ICD-10-CM | POA: Diagnosis not present

## 2022-03-17 DIAGNOSIS — K449 Diaphragmatic hernia without obstruction or gangrene: Secondary | ICD-10-CM | POA: Diagnosis not present

## 2022-03-17 DIAGNOSIS — Z85528 Personal history of other malignant neoplasm of kidney: Secondary | ICD-10-CM | POA: Diagnosis not present

## 2022-03-17 DIAGNOSIS — R911 Solitary pulmonary nodule: Secondary | ICD-10-CM | POA: Diagnosis not present

## 2022-03-17 DIAGNOSIS — C641 Malignant neoplasm of right kidney, except renal pelvis: Secondary | ICD-10-CM | POA: Diagnosis not present

## 2022-03-25 DIAGNOSIS — D49511 Neoplasm of unspecified behavior of right kidney: Secondary | ICD-10-CM | POA: Diagnosis not present

## 2022-04-15 DIAGNOSIS — C44612 Basal cell carcinoma of skin of right upper limb, including shoulder: Secondary | ICD-10-CM | POA: Diagnosis not present

## 2022-04-15 DIAGNOSIS — L718 Other rosacea: Secondary | ICD-10-CM | POA: Diagnosis not present

## 2022-04-15 DIAGNOSIS — D485 Neoplasm of uncertain behavior of skin: Secondary | ICD-10-CM | POA: Diagnosis not present

## 2022-04-15 DIAGNOSIS — L700 Acne vulgaris: Secondary | ICD-10-CM | POA: Diagnosis not present

## 2022-04-16 DIAGNOSIS — F331 Major depressive disorder, recurrent, moderate: Secondary | ICD-10-CM | POA: Diagnosis not present

## 2022-04-16 DIAGNOSIS — F411 Generalized anxiety disorder: Secondary | ICD-10-CM | POA: Diagnosis not present

## 2022-04-22 DIAGNOSIS — F331 Major depressive disorder, recurrent, moderate: Secondary | ICD-10-CM | POA: Diagnosis not present

## 2022-04-22 DIAGNOSIS — F411 Generalized anxiety disorder: Secondary | ICD-10-CM | POA: Diagnosis not present

## 2022-04-29 DIAGNOSIS — F411 Generalized anxiety disorder: Secondary | ICD-10-CM | POA: Diagnosis not present

## 2022-04-29 DIAGNOSIS — F331 Major depressive disorder, recurrent, moderate: Secondary | ICD-10-CM | POA: Diagnosis not present

## 2022-05-02 DIAGNOSIS — F411 Generalized anxiety disorder: Secondary | ICD-10-CM | POA: Diagnosis not present

## 2022-05-02 DIAGNOSIS — F331 Major depressive disorder, recurrent, moderate: Secondary | ICD-10-CM | POA: Diagnosis not present

## 2022-05-13 DIAGNOSIS — F411 Generalized anxiety disorder: Secondary | ICD-10-CM | POA: Diagnosis not present

## 2022-05-13 DIAGNOSIS — F331 Major depressive disorder, recurrent, moderate: Secondary | ICD-10-CM | POA: Diagnosis not present

## 2022-05-16 DIAGNOSIS — F5101 Primary insomnia: Secondary | ICD-10-CM | POA: Diagnosis not present

## 2022-05-16 DIAGNOSIS — F3342 Major depressive disorder, recurrent, in full remission: Secondary | ICD-10-CM | POA: Diagnosis not present

## 2022-05-16 DIAGNOSIS — F4321 Adjustment disorder with depressed mood: Secondary | ICD-10-CM | POA: Diagnosis not present

## 2022-05-23 DIAGNOSIS — F411 Generalized anxiety disorder: Secondary | ICD-10-CM | POA: Diagnosis not present

## 2022-05-23 DIAGNOSIS — F331 Major depressive disorder, recurrent, moderate: Secondary | ICD-10-CM | POA: Diagnosis not present

## 2022-05-28 DIAGNOSIS — F331 Major depressive disorder, recurrent, moderate: Secondary | ICD-10-CM | POA: Diagnosis not present

## 2022-05-28 DIAGNOSIS — F411 Generalized anxiety disorder: Secondary | ICD-10-CM | POA: Diagnosis not present

## 2022-06-06 DIAGNOSIS — M9901 Segmental and somatic dysfunction of cervical region: Secondary | ICD-10-CM | POA: Diagnosis not present

## 2022-06-06 DIAGNOSIS — M9902 Segmental and somatic dysfunction of thoracic region: Secondary | ICD-10-CM | POA: Diagnosis not present

## 2022-06-06 DIAGNOSIS — M542 Cervicalgia: Secondary | ICD-10-CM | POA: Diagnosis not present

## 2022-06-06 DIAGNOSIS — M546 Pain in thoracic spine: Secondary | ICD-10-CM | POA: Diagnosis not present

## 2022-06-13 DIAGNOSIS — F411 Generalized anxiety disorder: Secondary | ICD-10-CM | POA: Diagnosis not present

## 2022-06-13 DIAGNOSIS — F331 Major depressive disorder, recurrent, moderate: Secondary | ICD-10-CM | POA: Diagnosis not present

## 2022-06-25 DIAGNOSIS — M25561 Pain in right knee: Secondary | ICD-10-CM | POA: Diagnosis not present

## 2022-07-01 DIAGNOSIS — F331 Major depressive disorder, recurrent, moderate: Secondary | ICD-10-CM | POA: Diagnosis not present

## 2022-07-01 DIAGNOSIS — F411 Generalized anxiety disorder: Secondary | ICD-10-CM | POA: Diagnosis not present

## 2022-07-14 DIAGNOSIS — J3089 Other allergic rhinitis: Secondary | ICD-10-CM | POA: Diagnosis not present

## 2022-07-14 DIAGNOSIS — E669 Obesity, unspecified: Secondary | ICD-10-CM | POA: Diagnosis not present

## 2022-07-15 DIAGNOSIS — F331 Major depressive disorder, recurrent, moderate: Secondary | ICD-10-CM | POA: Diagnosis not present

## 2022-07-15 DIAGNOSIS — F411 Generalized anxiety disorder: Secondary | ICD-10-CM | POA: Diagnosis not present

## 2022-07-15 DIAGNOSIS — F5101 Primary insomnia: Secondary | ICD-10-CM | POA: Diagnosis not present

## 2022-07-29 DIAGNOSIS — C44612 Basal cell carcinoma of skin of right upper limb, including shoulder: Secondary | ICD-10-CM | POA: Diagnosis not present

## 2022-07-31 DIAGNOSIS — F331 Major depressive disorder, recurrent, moderate: Secondary | ICD-10-CM | POA: Diagnosis not present

## 2022-07-31 DIAGNOSIS — F411 Generalized anxiety disorder: Secondary | ICD-10-CM | POA: Diagnosis not present

## 2022-08-12 DIAGNOSIS — M7671 Peroneal tendinitis, right leg: Secondary | ICD-10-CM | POA: Diagnosis not present

## 2022-08-14 DIAGNOSIS — F411 Generalized anxiety disorder: Secondary | ICD-10-CM | POA: Diagnosis not present

## 2022-08-14 DIAGNOSIS — F331 Major depressive disorder, recurrent, moderate: Secondary | ICD-10-CM | POA: Diagnosis not present

## 2022-08-31 DIAGNOSIS — M25561 Pain in right knee: Secondary | ICD-10-CM | POA: Diagnosis not present

## 2022-09-04 DIAGNOSIS — S83231D Complex tear of medial meniscus, current injury, right knee, subsequent encounter: Secondary | ICD-10-CM | POA: Diagnosis not present

## 2022-09-09 DIAGNOSIS — M25561 Pain in right knee: Secondary | ICD-10-CM | POA: Diagnosis not present

## 2022-09-12 DIAGNOSIS — M25561 Pain in right knee: Secondary | ICD-10-CM | POA: Diagnosis not present

## 2022-09-15 ENCOUNTER — Other Ambulatory Visit (HOSPITAL_COMMUNITY): Payer: Self-pay

## 2022-09-15 ENCOUNTER — Ambulatory Visit (HOSPITAL_COMMUNITY)
Admission: RE | Admit: 2022-09-15 | Discharge: 2022-09-15 | Disposition: A | Discharge status: Home / Self Care | Payer: BC Managed Care – PPO | Source: Ambulatory Visit | Attending: Urology | Admitting: Urology

## 2022-09-15 ENCOUNTER — Other Ambulatory Visit (HOSPITAL_COMMUNITY): Payer: Self-pay | Admitting: Urology

## 2022-09-15 DIAGNOSIS — C642 Malignant neoplasm of left kidney, except renal pelvis: Secondary | ICD-10-CM | POA: Diagnosis not present

## 2022-09-15 DIAGNOSIS — C641 Malignant neoplasm of right kidney, except renal pelvis: Secondary | ICD-10-CM | POA: Diagnosis not present

## 2022-09-16 DIAGNOSIS — M25561 Pain in right knee: Secondary | ICD-10-CM | POA: Diagnosis not present

## 2022-09-17 DIAGNOSIS — M25561 Pain in right knee: Secondary | ICD-10-CM | POA: Diagnosis not present

## 2022-09-18 DIAGNOSIS — Z85528 Personal history of other malignant neoplasm of kidney: Secondary | ICD-10-CM | POA: Diagnosis not present

## 2022-09-18 DIAGNOSIS — Z90710 Acquired absence of both cervix and uterus: Secondary | ICD-10-CM | POA: Diagnosis not present

## 2022-09-18 DIAGNOSIS — Z905 Acquired absence of kidney: Secondary | ICD-10-CM | POA: Diagnosis not present

## 2022-09-18 DIAGNOSIS — C641 Malignant neoplasm of right kidney, except renal pelvis: Secondary | ICD-10-CM | POA: Diagnosis not present

## 2022-09-22 DIAGNOSIS — D49511 Neoplasm of unspecified behavior of right kidney: Secondary | ICD-10-CM | POA: Diagnosis not present

## 2022-09-23 DIAGNOSIS — F5101 Primary insomnia: Secondary | ICD-10-CM | POA: Diagnosis not present

## 2022-09-23 DIAGNOSIS — F3342 Major depressive disorder, recurrent, in full remission: Secondary | ICD-10-CM | POA: Diagnosis not present

## 2022-09-23 DIAGNOSIS — F411 Generalized anxiety disorder: Secondary | ICD-10-CM | POA: Diagnosis not present

## 2022-09-23 DIAGNOSIS — M25561 Pain in right knee: Secondary | ICD-10-CM | POA: Diagnosis not present

## 2022-09-24 DIAGNOSIS — M25561 Pain in right knee: Secondary | ICD-10-CM | POA: Diagnosis not present

## 2022-09-29 DIAGNOSIS — X58XXXA Exposure to other specified factors, initial encounter: Secondary | ICD-10-CM | POA: Diagnosis not present

## 2022-09-29 DIAGNOSIS — S83231A Complex tear of medial meniscus, current injury, right knee, initial encounter: Secondary | ICD-10-CM | POA: Diagnosis not present

## 2022-09-29 DIAGNOSIS — G8918 Other acute postprocedural pain: Secondary | ICD-10-CM | POA: Diagnosis not present

## 2022-09-29 DIAGNOSIS — M94261 Chondromalacia, right knee: Secondary | ICD-10-CM | POA: Diagnosis not present

## 2022-09-29 DIAGNOSIS — Y999 Unspecified external cause status: Secondary | ICD-10-CM | POA: Diagnosis not present

## 2022-09-29 DIAGNOSIS — M23221 Derangement of posterior horn of medial meniscus due to old tear or injury, right knee: Secondary | ICD-10-CM | POA: Diagnosis not present

## 2022-10-02 DIAGNOSIS — M25561 Pain in right knee: Secondary | ICD-10-CM | POA: Diagnosis not present

## 2022-10-03 DIAGNOSIS — F411 Generalized anxiety disorder: Secondary | ICD-10-CM | POA: Diagnosis not present

## 2022-10-03 DIAGNOSIS — F331 Major depressive disorder, recurrent, moderate: Secondary | ICD-10-CM | POA: Diagnosis not present

## 2022-10-06 DIAGNOSIS — M25561 Pain in right knee: Secondary | ICD-10-CM | POA: Diagnosis not present

## 2022-10-08 DIAGNOSIS — M25561 Pain in right knee: Secondary | ICD-10-CM | POA: Diagnosis not present

## 2022-10-13 DIAGNOSIS — Z01419 Encounter for gynecological examination (general) (routine) without abnormal findings: Secondary | ICD-10-CM | POA: Diagnosis not present

## 2022-10-13 DIAGNOSIS — Z1272 Encounter for screening for malignant neoplasm of vagina: Secondary | ICD-10-CM | POA: Diagnosis not present

## 2022-10-13 DIAGNOSIS — Z13820 Encounter for screening for osteoporosis: Secondary | ICD-10-CM | POA: Diagnosis not present

## 2022-10-13 DIAGNOSIS — Z1151 Encounter for screening for human papillomavirus (HPV): Secondary | ICD-10-CM | POA: Diagnosis not present

## 2022-10-13 DIAGNOSIS — Z1231 Encounter for screening mammogram for malignant neoplasm of breast: Secondary | ICD-10-CM | POA: Diagnosis not present

## 2022-10-13 DIAGNOSIS — Z6836 Body mass index (BMI) 36.0-36.9, adult: Secondary | ICD-10-CM | POA: Diagnosis not present

## 2022-10-14 DIAGNOSIS — M25561 Pain in right knee: Secondary | ICD-10-CM | POA: Diagnosis not present

## 2022-10-15 DIAGNOSIS — F411 Generalized anxiety disorder: Secondary | ICD-10-CM | POA: Diagnosis not present

## 2022-10-15 DIAGNOSIS — F331 Major depressive disorder, recurrent, moderate: Secondary | ICD-10-CM | POA: Diagnosis not present

## 2022-10-17 DIAGNOSIS — M25561 Pain in right knee: Secondary | ICD-10-CM | POA: Diagnosis not present

## 2022-10-21 DIAGNOSIS — M25561 Pain in right knee: Secondary | ICD-10-CM | POA: Diagnosis not present

## 2022-10-22 DIAGNOSIS — M8580 Other specified disorders of bone density and structure, unspecified site: Secondary | ICD-10-CM | POA: Diagnosis not present

## 2022-10-22 DIAGNOSIS — M818 Other osteoporosis without current pathological fracture: Secondary | ICD-10-CM | POA: Diagnosis not present

## 2022-10-24 DIAGNOSIS — M25561 Pain in right knee: Secondary | ICD-10-CM | POA: Diagnosis not present

## 2022-10-28 DIAGNOSIS — M25561 Pain in right knee: Secondary | ICD-10-CM | POA: Diagnosis not present

## 2022-11-03 DIAGNOSIS — F411 Generalized anxiety disorder: Secondary | ICD-10-CM | POA: Diagnosis not present

## 2022-11-03 DIAGNOSIS — F331 Major depressive disorder, recurrent, moderate: Secondary | ICD-10-CM | POA: Diagnosis not present

## 2022-11-04 DIAGNOSIS — F5101 Primary insomnia: Secondary | ICD-10-CM | POA: Diagnosis not present

## 2022-11-04 DIAGNOSIS — F411 Generalized anxiety disorder: Secondary | ICD-10-CM | POA: Diagnosis not present

## 2022-11-04 DIAGNOSIS — F3342 Major depressive disorder, recurrent, in full remission: Secondary | ICD-10-CM | POA: Diagnosis not present

## 2022-11-05 DIAGNOSIS — M25561 Pain in right knee: Secondary | ICD-10-CM | POA: Diagnosis not present

## 2022-11-06 DIAGNOSIS — M25561 Pain in right knee: Secondary | ICD-10-CM | POA: Diagnosis not present

## 2022-11-13 DIAGNOSIS — M25561 Pain in right knee: Secondary | ICD-10-CM | POA: Diagnosis not present

## 2022-11-14 DIAGNOSIS — Z6837 Body mass index (BMI) 37.0-37.9, adult: Secondary | ICD-10-CM | POA: Diagnosis not present

## 2022-11-14 DIAGNOSIS — R1084 Generalized abdominal pain: Secondary | ICD-10-CM | POA: Diagnosis not present

## 2022-11-25 DIAGNOSIS — F331 Major depressive disorder, recurrent, moderate: Secondary | ICD-10-CM | POA: Diagnosis not present

## 2022-11-25 DIAGNOSIS — F411 Generalized anxiety disorder: Secondary | ICD-10-CM | POA: Diagnosis not present

## 2022-11-25 DIAGNOSIS — F5101 Primary insomnia: Secondary | ICD-10-CM | POA: Diagnosis not present

## 2022-11-25 DIAGNOSIS — F4321 Adjustment disorder with depressed mood: Secondary | ICD-10-CM | POA: Diagnosis not present

## 2022-12-11 DIAGNOSIS — F411 Generalized anxiety disorder: Secondary | ICD-10-CM | POA: Diagnosis not present

## 2022-12-11 DIAGNOSIS — F331 Major depressive disorder, recurrent, moderate: Secondary | ICD-10-CM | POA: Diagnosis not present

## 2022-12-16 DIAGNOSIS — F3342 Major depressive disorder, recurrent, in full remission: Secondary | ICD-10-CM | POA: Diagnosis not present

## 2022-12-16 DIAGNOSIS — F5101 Primary insomnia: Secondary | ICD-10-CM | POA: Diagnosis not present

## 2022-12-16 DIAGNOSIS — F4322 Adjustment disorder with anxiety: Secondary | ICD-10-CM | POA: Diagnosis not present

## 2022-12-30 DIAGNOSIS — F411 Generalized anxiety disorder: Secondary | ICD-10-CM | POA: Diagnosis not present

## 2022-12-30 DIAGNOSIS — F331 Major depressive disorder, recurrent, moderate: Secondary | ICD-10-CM | POA: Diagnosis not present

## 2023-01-02 DIAGNOSIS — M25561 Pain in right knee: Secondary | ICD-10-CM | POA: Diagnosis not present

## 2023-01-05 DIAGNOSIS — F331 Major depressive disorder, recurrent, moderate: Secondary | ICD-10-CM | POA: Diagnosis not present

## 2023-01-05 DIAGNOSIS — F4321 Adjustment disorder with depressed mood: Secondary | ICD-10-CM | POA: Diagnosis not present

## 2023-01-05 DIAGNOSIS — F3341 Major depressive disorder, recurrent, in partial remission: Secondary | ICD-10-CM | POA: Diagnosis not present

## 2023-01-05 DIAGNOSIS — F4322 Adjustment disorder with anxiety: Secondary | ICD-10-CM | POA: Diagnosis not present

## 2023-01-09 DIAGNOSIS — F411 Generalized anxiety disorder: Secondary | ICD-10-CM | POA: Diagnosis not present

## 2023-01-09 DIAGNOSIS — F331 Major depressive disorder, recurrent, moderate: Secondary | ICD-10-CM | POA: Diagnosis not present

## 2023-01-20 DIAGNOSIS — F331 Major depressive disorder, recurrent, moderate: Secondary | ICD-10-CM | POA: Diagnosis not present

## 2023-01-20 DIAGNOSIS — F411 Generalized anxiety disorder: Secondary | ICD-10-CM | POA: Diagnosis not present

## 2023-01-26 DIAGNOSIS — F5101 Primary insomnia: Secondary | ICD-10-CM | POA: Diagnosis not present

## 2023-01-26 DIAGNOSIS — F331 Major depressive disorder, recurrent, moderate: Secondary | ICD-10-CM | POA: Diagnosis not present

## 2023-01-26 DIAGNOSIS — F411 Generalized anxiety disorder: Secondary | ICD-10-CM | POA: Diagnosis not present

## 2023-02-06 DIAGNOSIS — M25561 Pain in right knee: Secondary | ICD-10-CM | POA: Diagnosis not present

## 2023-02-09 DIAGNOSIS — F3341 Major depressive disorder, recurrent, in partial remission: Secondary | ICD-10-CM | POA: Diagnosis not present

## 2023-02-09 DIAGNOSIS — F5101 Primary insomnia: Secondary | ICD-10-CM | POA: Diagnosis not present

## 2023-02-19 DIAGNOSIS — F331 Major depressive disorder, recurrent, moderate: Secondary | ICD-10-CM | POA: Diagnosis not present

## 2023-02-19 DIAGNOSIS — F411 Generalized anxiety disorder: Secondary | ICD-10-CM | POA: Diagnosis not present

## 2023-03-04 DIAGNOSIS — F411 Generalized anxiety disorder: Secondary | ICD-10-CM | POA: Diagnosis not present

## 2023-03-04 DIAGNOSIS — F331 Major depressive disorder, recurrent, moderate: Secondary | ICD-10-CM | POA: Diagnosis not present

## 2023-03-16 DIAGNOSIS — D2372 Other benign neoplasm of skin of left lower limb, including hip: Secondary | ICD-10-CM | POA: Diagnosis not present

## 2023-03-16 DIAGNOSIS — L821 Other seborrheic keratosis: Secondary | ICD-10-CM | POA: Diagnosis not present

## 2023-03-16 DIAGNOSIS — L720 Epidermal cyst: Secondary | ICD-10-CM | POA: Diagnosis not present

## 2023-03-16 DIAGNOSIS — L84 Corns and callosities: Secondary | ICD-10-CM | POA: Diagnosis not present

## 2023-03-17 ENCOUNTER — Ambulatory Visit (HOSPITAL_COMMUNITY)
Admission: RE | Admit: 2023-03-17 | Discharge: 2023-03-17 | Disposition: A | Discharge status: Home / Self Care | Payer: BC Managed Care – PPO | Source: Ambulatory Visit | Attending: Urology | Admitting: Urology

## 2023-03-17 ENCOUNTER — Other Ambulatory Visit (HOSPITAL_COMMUNITY): Payer: Self-pay | Admitting: Urology

## 2023-03-17 DIAGNOSIS — C641 Malignant neoplasm of right kidney, except renal pelvis: Secondary | ICD-10-CM | POA: Diagnosis not present

## 2023-03-17 DIAGNOSIS — C642 Malignant neoplasm of left kidney, except renal pelvis: Secondary | ICD-10-CM | POA: Diagnosis not present

## 2023-03-17 DIAGNOSIS — F411 Generalized anxiety disorder: Secondary | ICD-10-CM | POA: Diagnosis not present

## 2023-03-17 DIAGNOSIS — F331 Major depressive disorder, recurrent, moderate: Secondary | ICD-10-CM | POA: Diagnosis not present

## 2023-03-17 DIAGNOSIS — Z85528 Personal history of other malignant neoplasm of kidney: Secondary | ICD-10-CM | POA: Diagnosis not present

## 2023-03-24 DIAGNOSIS — K449 Diaphragmatic hernia without obstruction or gangrene: Secondary | ICD-10-CM | POA: Diagnosis not present

## 2023-03-24 DIAGNOSIS — C641 Malignant neoplasm of right kidney, except renal pelvis: Secondary | ICD-10-CM | POA: Diagnosis not present

## 2023-03-25 DIAGNOSIS — F5101 Primary insomnia: Secondary | ICD-10-CM | POA: Diagnosis not present

## 2023-03-25 DIAGNOSIS — F3341 Major depressive disorder, recurrent, in partial remission: Secondary | ICD-10-CM | POA: Diagnosis not present

## 2023-03-25 DIAGNOSIS — F411 Generalized anxiety disorder: Secondary | ICD-10-CM | POA: Diagnosis not present

## 2023-03-31 DIAGNOSIS — F331 Major depressive disorder, recurrent, moderate: Secondary | ICD-10-CM | POA: Diagnosis not present

## 2023-03-31 DIAGNOSIS — F411 Generalized anxiety disorder: Secondary | ICD-10-CM | POA: Diagnosis not present

## 2023-04-02 DIAGNOSIS — C641 Malignant neoplasm of right kidney, except renal pelvis: Secondary | ICD-10-CM | POA: Diagnosis not present

## 2023-04-03 DIAGNOSIS — E669 Obesity, unspecified: Secondary | ICD-10-CM | POA: Diagnosis not present

## 2023-04-03 DIAGNOSIS — I10 Essential (primary) hypertension: Secondary | ICD-10-CM | POA: Diagnosis not present

## 2023-04-21 DIAGNOSIS — F411 Generalized anxiety disorder: Secondary | ICD-10-CM | POA: Diagnosis not present

## 2023-04-21 DIAGNOSIS — F331 Major depressive disorder, recurrent, moderate: Secondary | ICD-10-CM | POA: Diagnosis not present

## 2023-05-14 DIAGNOSIS — F331 Major depressive disorder, recurrent, moderate: Secondary | ICD-10-CM | POA: Diagnosis not present

## 2023-05-14 DIAGNOSIS — F411 Generalized anxiety disorder: Secondary | ICD-10-CM | POA: Diagnosis not present

## 2023-05-14 DIAGNOSIS — M25561 Pain in right knee: Secondary | ICD-10-CM | POA: Diagnosis not present

## 2023-05-19 DIAGNOSIS — L700 Acne vulgaris: Secondary | ICD-10-CM | POA: Diagnosis not present

## 2023-05-31 DIAGNOSIS — M25561 Pain in right knee: Secondary | ICD-10-CM | POA: Diagnosis not present

## 2023-06-16 DIAGNOSIS — M1731 Unilateral post-traumatic osteoarthritis, right knee: Secondary | ICD-10-CM | POA: Diagnosis not present

## 2023-06-23 DIAGNOSIS — F331 Major depressive disorder, recurrent, moderate: Secondary | ICD-10-CM | POA: Diagnosis not present

## 2023-06-23 DIAGNOSIS — F411 Generalized anxiety disorder: Secondary | ICD-10-CM | POA: Diagnosis not present

## 2023-07-07 DIAGNOSIS — F411 Generalized anxiety disorder: Secondary | ICD-10-CM | POA: Diagnosis not present

## 2023-07-07 DIAGNOSIS — F5101 Primary insomnia: Secondary | ICD-10-CM | POA: Diagnosis not present

## 2023-07-07 DIAGNOSIS — F3342 Major depressive disorder, recurrent, in full remission: Secondary | ICD-10-CM | POA: Diagnosis not present

## 2023-07-09 DIAGNOSIS — F411 Generalized anxiety disorder: Secondary | ICD-10-CM | POA: Diagnosis not present

## 2023-07-09 DIAGNOSIS — F331 Major depressive disorder, recurrent, moderate: Secondary | ICD-10-CM | POA: Diagnosis not present

## 2023-07-13 DIAGNOSIS — E669 Obesity, unspecified: Secondary | ICD-10-CM | POA: Diagnosis not present

## 2023-07-23 DIAGNOSIS — F331 Major depressive disorder, recurrent, moderate: Secondary | ICD-10-CM | POA: Diagnosis not present

## 2023-07-23 DIAGNOSIS — D485 Neoplasm of uncertain behavior of skin: Secondary | ICD-10-CM | POA: Diagnosis not present

## 2023-07-23 DIAGNOSIS — L720 Epidermal cyst: Secondary | ICD-10-CM | POA: Diagnosis not present

## 2023-07-23 DIAGNOSIS — F411 Generalized anxiety disorder: Secondary | ICD-10-CM | POA: Diagnosis not present

## 2023-07-23 DIAGNOSIS — L570 Actinic keratosis: Secondary | ICD-10-CM | POA: Diagnosis not present

## 2023-08-03 DIAGNOSIS — L720 Epidermal cyst: Secondary | ICD-10-CM | POA: Diagnosis not present

## 2023-08-20 DIAGNOSIS — F411 Generalized anxiety disorder: Secondary | ICD-10-CM | POA: Diagnosis not present

## 2023-08-20 DIAGNOSIS — Z0000 Encounter for general adult medical examination without abnormal findings: Secondary | ICD-10-CM | POA: Diagnosis not present

## 2023-08-20 DIAGNOSIS — E782 Mixed hyperlipidemia: Secondary | ICD-10-CM | POA: Diagnosis not present

## 2023-08-20 DIAGNOSIS — F331 Major depressive disorder, recurrent, moderate: Secondary | ICD-10-CM | POA: Diagnosis not present

## 2023-08-20 DIAGNOSIS — I10 Essential (primary) hypertension: Secondary | ICD-10-CM | POA: Diagnosis not present

## 2023-08-20 DIAGNOSIS — Z23 Encounter for immunization: Secondary | ICD-10-CM | POA: Diagnosis not present

## 2023-08-27 DIAGNOSIS — E782 Mixed hyperlipidemia: Secondary | ICD-10-CM | POA: Diagnosis not present

## 2023-08-28 ENCOUNTER — Other Ambulatory Visit: Payer: Self-pay | Admitting: Family Medicine

## 2023-08-28 DIAGNOSIS — E782 Mixed hyperlipidemia: Secondary | ICD-10-CM

## 2023-09-01 ENCOUNTER — Ambulatory Visit: Payer: Self-pay

## 2023-09-01 DIAGNOSIS — E782 Mixed hyperlipidemia: Secondary | ICD-10-CM

## 2023-09-03 ENCOUNTER — Other Ambulatory Visit: Payer: BC Managed Care – PPO

## 2023-09-15 DIAGNOSIS — F331 Major depressive disorder, recurrent, moderate: Secondary | ICD-10-CM | POA: Diagnosis not present

## 2023-09-15 DIAGNOSIS — F411 Generalized anxiety disorder: Secondary | ICD-10-CM | POA: Diagnosis not present

## 2023-09-17 DIAGNOSIS — M1731 Unilateral post-traumatic osteoarthritis, right knee: Secondary | ICD-10-CM | POA: Diagnosis not present

## 2023-09-18 DIAGNOSIS — F4010 Social phobia, unspecified: Secondary | ICD-10-CM | POA: Diagnosis not present

## 2023-09-18 DIAGNOSIS — F4322 Adjustment disorder with anxiety: Secondary | ICD-10-CM | POA: Diagnosis not present

## 2023-09-18 DIAGNOSIS — F5101 Primary insomnia: Secondary | ICD-10-CM | POA: Diagnosis not present

## 2023-09-18 DIAGNOSIS — F411 Generalized anxiety disorder: Secondary | ICD-10-CM | POA: Diagnosis not present

## 2023-09-29 DIAGNOSIS — F411 Generalized anxiety disorder: Secondary | ICD-10-CM | POA: Diagnosis not present

## 2023-09-29 DIAGNOSIS — F331 Major depressive disorder, recurrent, moderate: Secondary | ICD-10-CM | POA: Diagnosis not present

## 2023-10-20 DIAGNOSIS — F331 Major depressive disorder, recurrent, moderate: Secondary | ICD-10-CM | POA: Diagnosis not present

## 2023-10-20 DIAGNOSIS — F411 Generalized anxiety disorder: Secondary | ICD-10-CM | POA: Diagnosis not present

## 2023-10-27 DIAGNOSIS — Z1231 Encounter for screening mammogram for malignant neoplasm of breast: Secondary | ICD-10-CM | POA: Diagnosis not present

## 2023-10-27 DIAGNOSIS — Z6829 Body mass index (BMI) 29.0-29.9, adult: Secondary | ICD-10-CM | POA: Diagnosis not present

## 2023-10-27 DIAGNOSIS — Z01419 Encounter for gynecological examination (general) (routine) without abnormal findings: Secondary | ICD-10-CM | POA: Diagnosis not present

## 2023-11-17 DIAGNOSIS — R590 Localized enlarged lymph nodes: Secondary | ICD-10-CM | POA: Diagnosis not present

## 2023-11-18 HISTORY — PX: BREAST EXCISIONAL BIOPSY: SUR124

## 2023-11-24 DIAGNOSIS — F411 Generalized anxiety disorder: Secondary | ICD-10-CM | POA: Diagnosis not present

## 2023-11-24 DIAGNOSIS — F331 Major depressive disorder, recurrent, moderate: Secondary | ICD-10-CM | POA: Diagnosis not present

## 2023-12-02 DIAGNOSIS — M25561 Pain in right knee: Secondary | ICD-10-CM | POA: Diagnosis not present

## 2023-12-02 DIAGNOSIS — M79671 Pain in right foot: Secondary | ICD-10-CM | POA: Diagnosis not present

## 2023-12-02 DIAGNOSIS — M1731 Unilateral post-traumatic osteoarthritis, right knee: Secondary | ICD-10-CM | POA: Diagnosis not present

## 2023-12-08 DIAGNOSIS — F411 Generalized anxiety disorder: Secondary | ICD-10-CM | POA: Diagnosis not present

## 2023-12-08 DIAGNOSIS — F331 Major depressive disorder, recurrent, moderate: Secondary | ICD-10-CM | POA: Diagnosis not present

## 2023-12-10 ENCOUNTER — Other Ambulatory Visit: Payer: Self-pay | Admitting: Obstetrics and Gynecology

## 2023-12-10 DIAGNOSIS — R222 Localized swelling, mass and lump, trunk: Secondary | ICD-10-CM | POA: Diagnosis not present

## 2023-12-10 DIAGNOSIS — R2231 Localized swelling, mass and lump, right upper limb: Secondary | ICD-10-CM

## 2023-12-11 ENCOUNTER — Ambulatory Visit
Admission: RE | Admit: 2023-12-11 | Discharge: 2023-12-11 | Disposition: A | Discharge status: Home / Self Care | Payer: BC Managed Care – PPO | Source: Ambulatory Visit | Attending: Obstetrics and Gynecology | Admitting: Obstetrics and Gynecology

## 2023-12-11 DIAGNOSIS — R2231 Localized swelling, mass and lump, right upper limb: Secondary | ICD-10-CM

## 2023-12-11 DIAGNOSIS — N6331 Unspecified lump in axillary tail of the right breast: Secondary | ICD-10-CM | POA: Diagnosis not present

## 2023-12-14 ENCOUNTER — Other Ambulatory Visit: Payer: Self-pay | Admitting: Obstetrics and Gynecology

## 2023-12-14 DIAGNOSIS — R2231 Localized swelling, mass and lump, right upper limb: Secondary | ICD-10-CM

## 2023-12-17 DIAGNOSIS — L570 Actinic keratosis: Secondary | ICD-10-CM | POA: Diagnosis not present

## 2024-01-05 DIAGNOSIS — F3342 Major depressive disorder, recurrent, in full remission: Secondary | ICD-10-CM | POA: Diagnosis not present

## 2024-01-05 DIAGNOSIS — F5101 Primary insomnia: Secondary | ICD-10-CM | POA: Diagnosis not present

## 2024-01-05 DIAGNOSIS — F4010 Social phobia, unspecified: Secondary | ICD-10-CM | POA: Diagnosis not present

## 2024-01-08 ENCOUNTER — Ambulatory Visit
Admission: RE | Admit: 2024-01-08 | Discharge: 2024-01-08 | Disposition: A | Discharge status: Home / Self Care | Payer: BC Managed Care – PPO | Source: Ambulatory Visit | Attending: Obstetrics and Gynecology | Admitting: Obstetrics and Gynecology

## 2024-01-08 DIAGNOSIS — R2231 Localized swelling, mass and lump, right upper limb: Secondary | ICD-10-CM

## 2024-01-08 DIAGNOSIS — M75101 Unspecified rotator cuff tear or rupture of right shoulder, not specified as traumatic: Secondary | ICD-10-CM | POA: Diagnosis not present

## 2024-01-08 MED ORDER — GADOPICLENOL 0.5 MMOL/ML IV SOLN
6.0000 mL | Freq: Once | INTRAVENOUS | Status: AC | PRN
  Administered 2024-01-08: 6 mL via INTRAVENOUS

## 2024-02-04 DIAGNOSIS — M25511 Pain in right shoulder: Secondary | ICD-10-CM | POA: Diagnosis not present

## 2024-02-11 DIAGNOSIS — F411 Generalized anxiety disorder: Secondary | ICD-10-CM | POA: Diagnosis not present

## 2024-02-11 DIAGNOSIS — F331 Major depressive disorder, recurrent, moderate: Secondary | ICD-10-CM | POA: Diagnosis not present

## 2024-02-11 DIAGNOSIS — L700 Acne vulgaris: Secondary | ICD-10-CM | POA: Diagnosis not present

## 2024-02-11 DIAGNOSIS — L570 Actinic keratosis: Secondary | ICD-10-CM | POA: Diagnosis not present

## 2024-02-11 DIAGNOSIS — L718 Other rosacea: Secondary | ICD-10-CM | POA: Diagnosis not present

## 2024-03-02 DIAGNOSIS — M1731 Unilateral post-traumatic osteoarthritis, right knee: Secondary | ICD-10-CM | POA: Diagnosis not present

## 2024-03-24 DIAGNOSIS — F331 Major depressive disorder, recurrent, moderate: Secondary | ICD-10-CM | POA: Diagnosis not present

## 2024-03-24 DIAGNOSIS — F411 Generalized anxiety disorder: Secondary | ICD-10-CM | POA: Diagnosis not present

## 2024-03-29 DIAGNOSIS — C641 Malignant neoplasm of right kidney, except renal pelvis: Secondary | ICD-10-CM | POA: Diagnosis not present

## 2024-03-31 ENCOUNTER — Other Ambulatory Visit (HOSPITAL_COMMUNITY): Payer: Self-pay | Admitting: Urology

## 2024-03-31 ENCOUNTER — Ambulatory Visit (HOSPITAL_COMMUNITY)
Admission: RE | Admit: 2024-03-31 | Discharge: 2024-03-31 | Disposition: A | Discharge status: Home / Self Care | Source: Ambulatory Visit | Attending: Urology | Admitting: Urology

## 2024-03-31 DIAGNOSIS — C642 Malignant neoplasm of left kidney, except renal pelvis: Secondary | ICD-10-CM | POA: Insufficient documentation

## 2024-04-04 DIAGNOSIS — K449 Diaphragmatic hernia without obstruction or gangrene: Secondary | ICD-10-CM | POA: Diagnosis not present

## 2024-04-04 DIAGNOSIS — K5730 Diverticulosis of large intestine without perforation or abscess without bleeding: Secondary | ICD-10-CM | POA: Diagnosis not present

## 2024-04-04 DIAGNOSIS — D49511 Neoplasm of unspecified behavior of right kidney: Secondary | ICD-10-CM | POA: Diagnosis not present

## 2024-04-04 DIAGNOSIS — Z905 Acquired absence of kidney: Secondary | ICD-10-CM | POA: Diagnosis not present

## 2024-04-21 DIAGNOSIS — C641 Malignant neoplasm of right kidney, except renal pelvis: Secondary | ICD-10-CM | POA: Diagnosis not present

## 2024-04-26 DIAGNOSIS — F331 Major depressive disorder, recurrent, moderate: Secondary | ICD-10-CM | POA: Diagnosis not present

## 2024-04-26 DIAGNOSIS — F411 Generalized anxiety disorder: Secondary | ICD-10-CM | POA: Diagnosis not present

## 2024-04-27 DIAGNOSIS — D1721 Benign lipomatous neoplasm of skin and subcutaneous tissue of right arm: Secondary | ICD-10-CM | POA: Diagnosis not present

## 2024-05-09 DIAGNOSIS — F4010 Social phobia, unspecified: Secondary | ICD-10-CM | POA: Diagnosis not present

## 2024-05-09 DIAGNOSIS — F331 Major depressive disorder, recurrent, moderate: Secondary | ICD-10-CM | POA: Diagnosis not present

## 2024-05-09 DIAGNOSIS — F5101 Primary insomnia: Secondary | ICD-10-CM | POA: Diagnosis not present

## 2024-05-17 DIAGNOSIS — F331 Major depressive disorder, recurrent, moderate: Secondary | ICD-10-CM | POA: Diagnosis not present

## 2024-05-17 DIAGNOSIS — F411 Generalized anxiety disorder: Secondary | ICD-10-CM | POA: Diagnosis not present

## 2024-05-26 DIAGNOSIS — F411 Generalized anxiety disorder: Secondary | ICD-10-CM | POA: Diagnosis not present

## 2024-05-26 DIAGNOSIS — F331 Major depressive disorder, recurrent, moderate: Secondary | ICD-10-CM | POA: Diagnosis not present

## 2024-05-27 ENCOUNTER — Encounter (HOSPITAL_BASED_OUTPATIENT_CLINIC_OR_DEPARTMENT_OTHER): Payer: Self-pay | Admitting: Orthopedic Surgery

## 2024-06-03 ENCOUNTER — Encounter (HOSPITAL_BASED_OUTPATIENT_CLINIC_OR_DEPARTMENT_OTHER)
Admission: RE | Disposition: A | Discharge status: Home / Self Care | Payer: Self-pay | Source: Home / Self Care | Attending: Orthopedic Surgery

## 2024-06-03 ENCOUNTER — Encounter (HOSPITAL_BASED_OUTPATIENT_CLINIC_OR_DEPARTMENT_OTHER): Payer: Self-pay | Admitting: Orthopedic Surgery

## 2024-06-03 ENCOUNTER — Ambulatory Visit (HOSPITAL_BASED_OUTPATIENT_CLINIC_OR_DEPARTMENT_OTHER): Admitting: Anesthesiology

## 2024-06-03 ENCOUNTER — Ambulatory Visit (HOSPITAL_BASED_OUTPATIENT_CLINIC_OR_DEPARTMENT_OTHER)
Admission: RE | Admit: 2024-06-03 | Discharge: 2024-06-03 | Disposition: A | Discharge status: Home / Self Care | Attending: Orthopedic Surgery | Admitting: Orthopedic Surgery

## 2024-06-03 ENCOUNTER — Other Ambulatory Visit: Payer: Self-pay

## 2024-06-03 DIAGNOSIS — D171 Benign lipomatous neoplasm of skin and subcutaneous tissue of trunk: Secondary | ICD-10-CM | POA: Insufficient documentation

## 2024-06-03 DIAGNOSIS — R2231 Localized swelling, mass and lump, right upper limb: Secondary | ICD-10-CM | POA: Diagnosis not present

## 2024-06-03 DIAGNOSIS — D1721 Benign lipomatous neoplasm of skin and subcutaneous tissue of right arm: Secondary | ICD-10-CM | POA: Diagnosis not present

## 2024-06-03 DIAGNOSIS — Z01818 Encounter for other preprocedural examination: Secondary | ICD-10-CM

## 2024-06-03 DIAGNOSIS — R222 Localized swelling, mass and lump, trunk: Secondary | ICD-10-CM | POA: Diagnosis not present

## 2024-06-03 HISTORY — PX: EXCISION, MASS, UPPER EXTREMITY: SHX7567

## 2024-06-03 SURGERY — EXCISION, MASS, UPPER EXTREMITY
Anesthesia: General | Site: Chest | Laterality: Right

## 2024-06-03 MED ORDER — BUPIVACAINE HCL (PF) 0.25 % IJ SOLN
INTRAMUSCULAR | Status: DC | PRN
  Administered 2024-06-03: 10 mL

## 2024-06-03 MED ORDER — PROPOFOL 500 MG/50ML IV EMUL
INTRAVENOUS | Status: DC | PRN
  Administered 2024-06-03: 150 ug/kg/min via INTRAVENOUS

## 2024-06-03 MED ORDER — ONDANSETRON 4 MG PO TBDP: 4.0000 mg | ORAL_TABLET | Freq: Three times a day (TID) | ORAL | 0 refills | Status: AC | PRN

## 2024-06-03 MED ORDER — MIDAZOLAM HCL 5 MG/5ML IJ SOLN
INTRAMUSCULAR | Status: DC | PRN
  Administered 2024-06-03: 2 mg via INTRAVENOUS

## 2024-06-03 MED ORDER — DEXAMETHASONE SODIUM PHOSPHATE 10 MG/ML IJ SOLN
INTRAMUSCULAR | Status: DC | PRN
  Administered 2024-06-03: 10 mg via INTRAVENOUS

## 2024-06-03 MED ORDER — PROPOFOL 10 MG/ML IV BOLUS
INTRAVENOUS | Status: AC
Start: 2024-06-03 — End: 2024-06-03
  Filled 2024-06-03: qty 20

## 2024-06-03 MED ORDER — FENTANYL CITRATE (PF) 100 MCG/2ML IJ SOLN
INTRAMUSCULAR | Status: AC
  Filled 2024-06-03: qty 2

## 2024-06-03 MED ORDER — CEFAZOLIN SODIUM-DEXTROSE 2-4 GM/100ML-% IV SOLN
INTRAVENOUS | Status: AC
  Filled 2024-06-03: qty 100

## 2024-06-03 MED ORDER — MIDAZOLAM HCL 2 MG/2ML IJ SOLN
INTRAMUSCULAR | Status: AC
  Filled 2024-06-03: qty 2

## 2024-06-03 MED ORDER — FENTANYL CITRATE (PF) 100 MCG/2ML IJ SOLN
INTRAMUSCULAR | Status: DC | PRN
  Administered 2024-06-03: 50 ug via INTRAVENOUS

## 2024-06-03 MED ORDER — LIDOCAINE HCL (CARDIAC) PF 100 MG/5ML IV SOSY
PREFILLED_SYRINGE | INTRAVENOUS | Status: DC | PRN
  Administered 2024-06-03: 40 mg via INTRAVENOUS

## 2024-06-03 MED ORDER — OXYCODONE HCL 5 MG PO TABS: 5.0000 mg | ORAL_TABLET | ORAL | 0 refills | Status: AC | PRN

## 2024-06-03 MED ORDER — OXYCODONE HCL 5 MG PO TABS
5.0000 mg | ORAL_TABLET | Freq: Once | ORAL | Status: AC
  Administered 2024-06-03: 5 mg via ORAL

## 2024-06-03 MED ORDER — ONDANSETRON HCL 4 MG/2ML IJ SOLN
INTRAMUSCULAR | Status: AC
  Filled 2024-06-03: qty 2

## 2024-06-03 MED ORDER — 0.9 % SODIUM CHLORIDE (POUR BTL) OPTIME
TOPICAL | Status: DC | PRN
Start: 2024-06-03 — End: 2024-06-03
  Administered 2024-06-03: 1000 mL

## 2024-06-03 MED ORDER — VANCOMYCIN HCL 1 G IV SOLR
INTRAVENOUS | Status: DC | PRN
  Administered 2024-06-03: 1000 mg via TOPICAL

## 2024-06-03 MED ORDER — CEFAZOLIN SODIUM-DEXTROSE 2-4 GM/100ML-% IV SOLN
2.0000 g | INTRAVENOUS | Status: AC
  Administered 2024-06-03: 2 g via INTRAVENOUS

## 2024-06-03 MED ORDER — PHENYLEPHRINE HCL (PRESSORS) 10 MG/ML IV SOLN
INTRAVENOUS | Status: DC | PRN
Start: 2024-06-03 — End: 2024-06-03
  Administered 2024-06-03: 80 ug via INTRAVENOUS
  Administered 2024-06-03 (×2): 120 ug via INTRAVENOUS
  Administered 2024-06-03: 80 ug via INTRAVENOUS

## 2024-06-03 MED ORDER — OXYCODONE HCL 5 MG PO TABS
ORAL_TABLET | ORAL | Status: AC
  Filled 2024-06-03: qty 1

## 2024-06-03 MED ORDER — VANCOMYCIN HCL 1000 MG IV SOLR
INTRAVENOUS | Status: AC
  Filled 2024-06-03: qty 20

## 2024-06-03 MED ORDER — PROPOFOL 10 MG/ML IV BOLUS
INTRAVENOUS | Status: DC | PRN
Start: 2024-06-03 — End: 2024-06-03
  Administered 2024-06-03: 150 mg via INTRAVENOUS

## 2024-06-03 MED ORDER — ONDANSETRON HCL 4 MG/2ML IJ SOLN
INTRAMUSCULAR | Status: DC | PRN
  Administered 2024-06-03: 4 mg via INTRAVENOUS

## 2024-06-03 MED ORDER — LACTATED RINGERS IV SOLN: INTRAVENOUS | Status: DC

## 2024-06-03 SURGICAL SUPPLY — 47 items
BLADE SURG 15 STRL LF DISP TIS (BLADE) ×1 IMPLANT
BNDG COHESIVE 4X5 TAN STRL LF (GAUZE/BANDAGES/DRESSINGS) IMPLANT
BNDG COMPR ESMARK 6X3 LF (GAUZE/BANDAGES/DRESSINGS) ×1 IMPLANT
BNDG ELASTIC 4INX 5YD STR LF (GAUZE/BANDAGES/DRESSINGS) IMPLANT
BNDG ELASTIC 6INX 5YD STR LF (GAUZE/BANDAGES/DRESSINGS) ×1 IMPLANT
CHLORAPREP W/TINT 26 (MISCELLANEOUS) ×1 IMPLANT
CLSR STERI-STRIP ANTIMIC 1/2X4 (GAUZE/BANDAGES/DRESSINGS) ×1 IMPLANT
DRAPE EXTREMITY T 121X128X90 (DISPOSABLE) ×1 IMPLANT
DRAPE IMP U-DRAPE 54X76 (DRAPES) ×1 IMPLANT
DRAPE INCISE IOBAN 66X45 STRL (DRAPES) IMPLANT
DRAPE LAPAROTOMY 100X72 PEDS (DRAPES) IMPLANT
DRAPE OEC MINIVIEW 54X84 (DRAPES) IMPLANT
DRAPE U-SHAPE 47X51 STRL (DRAPES) ×1 IMPLANT
DRAPE UTILITY XL STRL (DRAPES) IMPLANT
DRSG AQUACEL AG ADV 3.5X 6 (GAUZE/BANDAGES/DRESSINGS) IMPLANT
DRSG AQUACEL AG ADV 3.5X10 (GAUZE/BANDAGES/DRESSINGS) IMPLANT
ELECTRODE REM PT RTRN 9FT ADLT (ELECTROSURGICAL) ×1 IMPLANT
GAUZE SPONGE 4X4 12PLY STRL (GAUZE/BANDAGES/DRESSINGS) ×1 IMPLANT
GLOVE BIO SURGEON STRL SZ 6.5 (GLOVE) IMPLANT
GLOVE BIO SURGEON STRL SZ7.5 (GLOVE) ×2 IMPLANT
GLOVE BIOGEL PI IND STRL 6.5 (GLOVE) IMPLANT
GLOVE BIOGEL PI IND STRL 7.0 (GLOVE) IMPLANT
GLOVE BIOGEL PI IND STRL 8 (GLOVE) ×2 IMPLANT
GOWN STRL REUS W/ TWL LRG LVL3 (GOWN DISPOSABLE) ×1 IMPLANT
GOWN STRL REUS W/TWL XL LVL3 (GOWN DISPOSABLE) ×2 IMPLANT
NDL HYPO 25X1 1.5 SAFETY (NEEDLE) IMPLANT
NEEDLE HYPO 25X1 1.5 SAFETY (NEEDLE) ×1 IMPLANT
NS IRRIG 1000ML POUR BTL (IV SOLUTION) ×1 IMPLANT
PACK ARTHROSCOPY DSU (CUSTOM PROCEDURE TRAY) ×1 IMPLANT
PACK BASIN DAY SURGERY FS (CUSTOM PROCEDURE TRAY) ×1 IMPLANT
PAD CAST 4YDX4 CTTN HI CHSV (CAST SUPPLIES) ×1 IMPLANT
PADDING CAST ABS COTTON 6X4 NS (CAST SUPPLIES) IMPLANT
PENCIL SMOKE EVACUATOR (MISCELLANEOUS) ×1 IMPLANT
SLEEVE SCD COMPRESS KNEE MED (STOCKING) IMPLANT
SLING ARM FOAM STRAP MED (SOFTGOODS) IMPLANT
SPIKE FLUID TRANSFER (MISCELLANEOUS) IMPLANT
SPONGE T-LAP 18X18 ~~LOC~~+RFID (SPONGE) ×1 IMPLANT
STOCKINETTE TUBULAR 6 INCH (GAUZE/BANDAGES/DRESSINGS) IMPLANT
SUT MNCRL AB 4-0 PS2 18 (SUTURE) IMPLANT
SUT VIC AB 0 CT1 27XBRD ANBCTR (SUTURE) ×1 IMPLANT
SUT VIC AB 2-0 CT1 TAPERPNT 27 (SUTURE) ×1 IMPLANT
SUT VIC AB 3-0 SH 27X BRD (SUTURE) IMPLANT
SYR BULB EAR ULCER 3OZ GRN STR (SYRINGE) ×1 IMPLANT
SYR CONTROL 10ML LL (SYRINGE) IMPLANT
TOWEL GREEN STERILE FF (TOWEL DISPOSABLE) ×1 IMPLANT
TUBE CONNECTING 20X1/4 (TUBING) IMPLANT
YANKAUER SUCT BULB TIP NO VENT (SUCTIONS) IMPLANT

## 2024-06-03 NOTE — Brief Op Note (Signed)
 06/03/2024  8:58 AM  PATIENT:  Angelica Morgan  58 y.o. female  PRE-OPERATIVE DIAGNOSIS:  Right axillary mass  POST-OPERATIVE DIAGNOSIS:  Right axillary mass  PROCEDURE:  Procedure(s): EXCISION, MASS, UPPER EXTREMITY (Right)  SURGEON:  Surgeons and Role:    DEWAINE Gosling, Selinda Dover, MD - Primary  PHYSICIAN ASSISTANT: none  ASSISTANTS: Jon Hurst, RNFA   ANESTHESIA:   local and general  EBL:  50 mL   BLOOD ADMINISTERED:none  DRAINS: none   LOCAL MEDICATIONS USED:  MARCAINE      SPECIMEN:  Source of Specimen:  Right axillary mass to pathology  DISPOSITION OF SPECIMEN:  PATHOLOGY  COUNTS:  YES  TOURNIQUET:  * No tourniquets in log *  DICTATION: .Note written in EPIC  PLAN OF CARE: Discharge to home after PACU  PATIENT DISPOSITION:  PACU - hemodynamically stable.   Delay start of Pharmacological VTE agent (>24hrs) due to surgical blood loss or risk of bleeding: not applicable

## 2024-06-03 NOTE — Anesthesia Procedure Notes (Signed)
 Procedure Name: LMA Insertion Date/Time: 06/03/2024 8:11 AM  Performed by: Frost Kayla MATSU, CRNAPre-anesthesia Checklist: Patient identified, Emergency Drugs available, Suction available and Patient being monitored Patient Re-evaluated:Patient Re-evaluated prior to induction Oxygen Delivery Method: Circle system utilized Preoxygenation: Pre-oxygenation with 100% oxygen Induction Type: IV induction LMA: LMA inserted LMA Size: 4.0 Number of attempts: 1 Placement Confirmation: positive ETCO2 Tube secured with: Tape Dental Injury: Teeth and Oropharynx as per pre-operative assessment

## 2024-06-03 NOTE — Discharge Instructions (Addendum)
 Orthopedic surgery discharge instructions:  -Maintain postoperative bandage until follow-up appointment.  This is waterproof, and you may begin showering on postoperative day #3.  Do not submerge underwater.  Maintain that bandage until your follow-up appointment in 2 weeks.  -No lifting over 2 pounds with operateive arm.  You may use the arm immediately for activities of daily living such as bathing, washing your face and brushing your teeth, eating, and getting dressed.  Otherwise maintain your sling when you are out of the house and sleeping for the first 3-5 days post op.  Then you may completely dc the sling.  -Apply ice liberally to the shoulder throughout the day.  For mild to moderate pain use Tylenol  and Advil  as needed around-the-clock.  For breakthrough pain use oxycodone  as necessary.  -You will return to see Dr. Sharl in the office in 2 weeks for routine postoperative check.    Post Anesthesia Home Care Instructions  Activity: Get plenty of rest for the remainder of the day. A responsible individual must stay with you for 24 hours following the procedure.  For the next 24 hours, DO NOT: -Drive a car -Advertising copywriter -Drink alcoholic beverages -Take any medication unless instructed by your physician -Make any legal decisions or sign important papers.  Meals: Start with liquid foods such as gelatin or soup. Progress to regular foods as tolerated. Avoid greasy, spicy, heavy foods. If nausea and/or vomiting occur, drink only clear liquids until the nausea and/or vomiting subsides. Call your physician if vomiting continues.  Special Instructions/Symptoms: Your throat may feel dry or sore from the anesthesia or the breathing tube placed in your throat during surgery. If this causes discomfort, gargle with warm salt water. The discomfort should disappear within 24 hours.  If you had a scopolamine  patch placed behind your ear for the management of post- operative nausea and/or  vomiting:  1. The medication in the patch is effective for 72 hours, after which it should be removed.  Wrap patch in a tissue and discard in the trash. Wash hands thoroughly with soap and water. 2. You may remove the patch earlier than 72 hours if you experience unpleasant side effects which may include dry mouth, dizziness or visual disturbances. 3. Avoid touching the patch. Wash your hands with soap and water after contact with the patch.

## 2024-06-03 NOTE — Op Note (Signed)
 Date of Surgery: 06/03/2024  INDICATIONS: Angelica Morgan is a 58 y.o.-year-old female with a right axillary mass who presents today for open excisional biopsy.  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  Due to the increasing nature of its size she elected for open excisional biopsy.  Plan was for en bloc resection.;  The patient did consent to the procedure after discussion of the risks and benefits.  PREOPERATIVE DIAGNOSIS:  Right axillary mass  POSTOPERATIVE DIAGNOSIS: Same.  PROCEDURE: Right axillary open excision of subfascial mass Lipomatous mass measuring 15 cm x 10 cm x 10 cm  SURGEON: Selinda SHAUNNA Gosling, M.D.  ASSIST: Jon Hurst, RNFA.  ANESTHESIA:  general, 30 cc of quarter percent Marcaine  with epinephrine   IV FLUIDS AND URINE: See anesthesia.  ESTIMATED BLOOD LOSS: 30 mL.  IMPLANTS: None  DRAINS: None  COMPLICATIONS: None.  DESCRIPTION OF PROCEDURE: The patient was brought to the operating room and placed supine on the operating table.  The patient had been signed prior to the procedure and this was documented. The patient had the anesthesia placed by the anesthesiologist.  A time-out was performed to confirm that this was the correct patient, site, side and location. The patient did receive antibiotics prior to the incision and was re-dosed during the procedure as needed at indicated intervals.  A tourniquet was not placed.  The patient had the operative extremity prepped and draped in the standard surgical fashion.     We began the procedure with a saber incision along the inferior border of the pectoralis major tendon.  This was where the mass was easily palpable.  We centered the incision along that palpable mass.  Next, we dissected down through skin and subcutaneous tissue.  We initially encountered benign-appearing subcutaneous fat tissue.  We did excise a cuff of this so that we could move more  freely.  At that depth just deep to the subcutaneous fat we then encountered an obvious lipomatous mass.  Deep retractors were utilized to retract the pectoralis muscle as well as the posterior skin flap.  We then bluntly and circumferentially moved around the fat mass.  Bleeders were addressed with electrocautery as necessary.  As we continued to deliver the mass we noted that it did track quite deep.  This was tracking down into the axilla adjacent to the lateral chest wall.  We continued with blunt dissection only and were able to get to the stalk which did seem to be emanating from the deep space between the latissimus and the chest wall.  We then were able to truncate with combination of electrocautery and Metzenbaum scissor.  We then passed the mass off the back field.  This measured approximately 15 cm x 10 cm x 10 cm.  Next, we copiously irrigated our wound.  We again placed deep retractors into the residual cavity and identified any venous bleeders.  We these were addressed with electrocautery.  Next, we irrigated copiously with normal saline and placed a gram of vancomycin powder.  We then closed in layers with 0 Vicryl to close the dead space between the pectoralis and the posterior skin flap.  We then used 2-0 Vicryl for deep dermal layer and 4-0 subcuticular Monocryl for skin.  Steri-Strips were applied as well as an occlusive dressing.  The wound was infiltrated with 30 cc of quarter percent Marcaine  with epinephrine   The arm is clean and dry and placed into a sling.  The  patient was awakened from general anesthetic in stable condition.  She was transported to PACU in stable condition.  All counts were correct x 2.  POSTOPERATIVE PLAN:  Specimen was sent off to pathology for identification.  Will plan on seeing Angelica Morgan back in the office in 2 weeks for routine follow-up.  She will be able to use the arm as tolerated.  However we use a sling for the first 2 to 3 days as well as aggressive  icing to reduce the incident of seroma or hematoma.  Will see her back in the office in 2 weeks for wound check as well as to review the pathology.

## 2024-06-03 NOTE — H&P (Signed)
 ORTHOPAEDIC H&P  REQUESTING PHYSICIAN: Sharl Selinda Dover, MD  PCP:  Crecencio Chiquita POUR, FNP  Chief Complaint: Right axillary mass  HPI: Angelica Morgan is a 58 y.o. female who complains of increasing size axillary mass right side.  There is lower open excision of likely lipoma.  No new complaints.  Past Medical History:  Diagnosis Date   Anxiety    Arthritis    Depression    Edema    legs and feet   PONV (postoperative nausea and vomiting)    Seasonal allergies    SVD (spontaneous vaginal delivery)    x 2   Past Surgical History:  Procedure Laterality Date   BREAST BIOPSY Right    CESAREAN SECTION     x 1   LAPAROSCOPIC ASSISTED VAGINAL HYSTERECTOMY N/A 04/13/2013   Procedure: LAPAROSCOPIC ASSISTED VAGINAL HYSTERECTOMY;  Surgeon: Truman Corona, MD;  Location: [facility];  Service: Gynecology;  Laterality: N/A;   LAPAROSCOPIC NEPHRECTOMY Right 06/05/2021   Procedure: LAPAROSCOPIC  RADICAL NEPHRECTOMY WITH DUDODENAL REPAIR WITH OMENTOPEXY;  Surgeon: Cam Morene ORN, MD;  Location: [facility];  Service: Urology;  Laterality: Right;   MENISCECTOMY Right    2022   NOVASURE ABLATION     WISDOM TOOTH EXTRACTION     Social History   Socioeconomic History   Marital status: Married    Spouse name: Not on file   Number of children: Not on file   Years of education: Not on file   Highest education level: Not on file  Occupational History   Not on file  Tobacco Use   Smoking status: Never   Smokeless tobacco: Never  Vaping Use   Vaping status: Never Used  Substance and Sexual Activity   Alcohol use: Yes    Comment: socially   Drug use: No   Sexual activity: Yes    Birth control/protection: None    Comment: husband vasectomy  Other Topics Concern   Not on file  Social History Narrative   Not on file   Social Drivers of Health   Financial Resource Strain: Not on file  Food Insecurity: Not on file  Transportation Needs: Not on file  Physical Activity: Not on  file  Stress: Not on file  Social Connections: Not on file   Family History  Problem Relation Age of Onset   Breast cancer Sister 27   Allergies  Allergen Reactions   Penicillins Diarrhea    Stomach cramps   Prior to Admission medications   Medication Sig Start Date End Date Taking? Authorizing Provider  ALPRAZolam  (XANAX ) 0.5 MG tablet Take 0.5 mg by mouth in the morning and at bedtime. 03/08/21  Yes [provider]  azelastine (OPTIVAR) 0.05 % ophthalmic solution Place 1 drop into both eyes daily as needed for allergies. 03/31/21  Yes [provider]  bimatoprost (LATISSE) 0.03 % ophthalmic solution Place 1 application into both eyes at bedtime. 05/17/21  Yes [provider]  cetirizine (ZYRTEC) 10 MG tablet Take 10 mg by mouth daily.   Yes [provider]  Cyanocobalamin (VITAMIN B 12 PO) Take by mouth.   Yes [provider]  DULoxetine  (CYMBALTA ) 60 MG capsule Take 1 capsule (60 mg total) by mouth 2 (two) times daily. Patient taking differently: Take 60 mg by mouth in the morning. 02/05/19  Yes Starkes-Perry, Majel RAMAN, FNP  escitalopram (LEXAPRO) 20 MG tablet Take 20 mg by mouth daily.   Yes [provider]  estradiol (ESTRACE) 0.1 MG/GM vaginal cream  Place 1 Applicatorful vaginally daily as needed (irritation). 05/11/21  Yes [provider]  fluticasone (FLONASE) 50 MCG/ACT nasal spray Place 1 spray into both nostrils daily as needed for allergies.   Yes [provider]  Multiple Vitamin (MULTIVITAMIN WITH MINERALS) TABS Take 1 tablet by mouth daily.   Yes [provider]  tirzepatide (ZEPBOUND) 2.5 MG/0.5ML Pen Inject 2.5 mg into the skin once a week.   Yes [provider]  zolpidem  (AMBIEN  CR) 12.5 MG CR tablet Take 12.5 mg by mouth at bedtime as needed for sleep.   Yes [provider]   No results found.  Positive ROS: All other systems have been reviewed and were otherwise negative with  the exception of those mentioned in the HPI and as above.  Physical Exam: General: Alert, no acute distress Cardiovascular: No pedal edema Respiratory: No cyanosis, no use of accessory musculature GI: No organomegaly, abdomen is soft and non-tender Skin: No lesions in the area of chief complaint Neurologic: Sensation intact distally Psychiatric: Patient is competent for consent with normal mood and affect Lymphatic: No axillary or cervical lymphadenopathy  MUSCULOSKELETAL: Right upper extremity is warm and well-perfused.  No lesions.  Assessment: Right axillary lipoma  Plan: We have discussed the procedure for open excision.  She agreed with proceeding.  Discussed risk of bleeding, infection/nerves breast, recurrence, need for further exploration, seroma, hematoma, risk of anesthesia.  Informed consent.  Plan for discharge on postop packing.    [name], MD Cell [phone]    06/03/2024 7:35 AM

## 2024-06-03 NOTE — Transfer of Care (Signed)
 Immediate Anesthesia Transfer of Care Note  Patient: Angelica Morgan  Procedure(s) Performed: EXCISION, MASS, UPPER EXTREMITY (Right: Chest)  Patient Location: PACU  Anesthesia Type:General  Level of Consciousness: drowsy, patient cooperative, and responds to stimulation  Airway & Oxygen Therapy: Patient Spontanous Breathing and Patient connected to face mask oxygen  Post-op Assessment: Report given to RN and Post -op Vital signs reviewed and stable  Post vital signs: Reviewed and stable  Last Vitals:  Vitals Value Taken Time  BP 88/58 06/03/24 09:12  Temp    Pulse 74 06/03/24 09:13  Resp 21 06/03/24 09:13  SpO2 100 % 06/03/24 09:13  Vitals shown include unfiled device data.  Last Pain:  Vitals:   06/03/24 0707  TempSrc: Temporal  PainSc: 0-No pain      Patients Stated Pain Goal: 3 (06/03/24 0707)  Complications: No notable events documented.

## 2024-06-03 NOTE — Anesthesia Postprocedure Evaluation (Signed)
 Anesthesia Post Note  Patient: Angelica Morgan  Procedure(s) Performed: EXCISION, MASS, UPPER EXTREMITY (Right: Chest)     Patient location during evaluation: PACU Anesthesia Type: General Level of consciousness: awake and alert and oriented Pain management: pain level controlled Vital Signs Assessment: post-procedure vital signs reviewed and stable Respiratory status: spontaneous breathing, nonlabored ventilation and respiratory function stable Cardiovascular status: blood pressure returned to baseline and stable Postop Assessment: no apparent nausea or vomiting Anesthetic complications: no   No notable events documented.  Last Vitals:  Vitals:   06/03/24 0930 06/03/24 0948  BP: 94/63 91/64  Pulse: 61 (!) 59  Resp: 15 16  Temp:  (!) 36.3 C  SpO2: 97% 97%    Last Pain:  Vitals:   06/03/24 0948  TempSrc:   PainSc: 4                  Jethro Radke A.

## 2024-06-03 NOTE — Anesthesia Preprocedure Evaluation (Addendum)
 Anesthesia Evaluation  Patient identified by MRN, date of birth, ID band Patient awake    Reviewed: Allergy & Precautions, NPO status , Patient's Chart, lab work & pertinent test results  History of Anesthesia Complications (+) PONV and history of anesthetic complications  Airway Mallampati: II  TM Distance: >3 FB     Dental no notable dental hx. 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  Renal/GU Renal InsufficiencyRenal diseaseHx/o Renal cell Ca right kidney S/P radical nephrectomy  negative genitourinary   Musculoskeletal  (+) Arthritis , Osteoarthritis,  Soft tissue mass right upper extremity   Abdominal   Peds  Hematology negative hematology ROS (+)   Anesthesia Other Findings   Reproductive/Obstetrics                              Anesthesia Physical Anesthesia Plan  ASA: 2  Anesthesia Plan: General   Post-op Pain Management: Minimal or no pain anticipated and Precedex   Induction: Intravenous  PONV Risk Score and Plan: 4 or greater and Treatment may vary due to age or medical condition, Midazolam , Propofol  infusion and TIVA  Airway Management Planned: LMA  Additional Equipment: None  Intra-op Plan:   Post-operative Plan: Extubation in OR  Informed Consent: I have reviewed the patients History and Physical, chart, labs and discussed the procedure including the risks, benefits and alternatives for the proposed anesthesia with the patient or authorized representative who has indicated  his/her understanding and acceptance.     Dental advisory given  Plan Discussed with: CRNA and Anesthesiologist  Anesthesia Plan Comments:          Anesthesia Quick Evaluation

## 2024-06-03 NOTE — Progress Notes (Signed)
 Time received oxycodone  last written on dc paperwork. Verified with Dr Jerrye ok for pt to take oxycodone  as pt states she has only one kidney, per pt request.

## 2024-06-04 ENCOUNTER — Encounter (HOSPITAL_BASED_OUTPATIENT_CLINIC_OR_DEPARTMENT_OTHER): Payer: Self-pay | Admitting: Orthopedic Surgery

## 2024-06-07 DIAGNOSIS — F331 Major depressive disorder, recurrent, moderate: Secondary | ICD-10-CM | POA: Diagnosis not present

## 2024-06-07 DIAGNOSIS — F411 Generalized anxiety disorder: Secondary | ICD-10-CM | POA: Diagnosis not present

## 2024-06-07 LAB — SURGICAL PATHOLOGY

## 2024-06-13 DIAGNOSIS — Z6829 Body mass index (BMI) 29.0-29.9, adult: Secondary | ICD-10-CM | POA: Diagnosis not present

## 2024-06-13 DIAGNOSIS — I9589 Other hypotension: Secondary | ICD-10-CM | POA: Diagnosis not present

## 2024-06-21 DIAGNOSIS — F411 Generalized anxiety disorder: Secondary | ICD-10-CM | POA: Diagnosis not present

## 2024-06-21 DIAGNOSIS — F331 Major depressive disorder, recurrent, moderate: Secondary | ICD-10-CM | POA: Diagnosis not present

## 2024-06-28 DIAGNOSIS — M25512 Pain in left shoulder: Secondary | ICD-10-CM | POA: Diagnosis not present

## 2024-06-29 DIAGNOSIS — F4010 Social phobia, unspecified: Secondary | ICD-10-CM | POA: Diagnosis not present

## 2024-06-29 DIAGNOSIS — F5101 Primary insomnia: Secondary | ICD-10-CM | POA: Diagnosis not present

## 2024-06-29 DIAGNOSIS — F4322 Adjustment disorder with anxiety: Secondary | ICD-10-CM | POA: Diagnosis not present

## 2024-07-05 DIAGNOSIS — M25512 Pain in left shoulder: Secondary | ICD-10-CM | POA: Diagnosis not present

## 2024-07-12 DIAGNOSIS — M25512 Pain in left shoulder: Secondary | ICD-10-CM | POA: Diagnosis not present

## 2024-07-13 DIAGNOSIS — F411 Generalized anxiety disorder: Secondary | ICD-10-CM | POA: Diagnosis not present

## 2024-07-13 DIAGNOSIS — F331 Major depressive disorder, recurrent, moderate: Secondary | ICD-10-CM | POA: Diagnosis not present

## 2024-07-20 ENCOUNTER — Other Ambulatory Visit: Payer: Self-pay | Admitting: Obstetrics and Gynecology

## 2024-07-20 DIAGNOSIS — M25512 Pain in left shoulder: Secondary | ICD-10-CM | POA: Diagnosis not present

## 2024-07-20 DIAGNOSIS — Z1231 Encounter for screening mammogram for malignant neoplasm of breast: Secondary | ICD-10-CM

## 2024-07-22 DIAGNOSIS — F411 Generalized anxiety disorder: Secondary | ICD-10-CM | POA: Diagnosis not present

## 2024-07-22 DIAGNOSIS — M25512 Pain in left shoulder: Secondary | ICD-10-CM | POA: Diagnosis not present

## 2024-07-22 DIAGNOSIS — F331 Major depressive disorder, recurrent, moderate: Secondary | ICD-10-CM | POA: Diagnosis not present

## 2024-07-26 DIAGNOSIS — M25512 Pain in left shoulder: Secondary | ICD-10-CM | POA: Diagnosis not present

## 2024-07-28 DIAGNOSIS — M25512 Pain in left shoulder: Secondary | ICD-10-CM | POA: Diagnosis not present

## 2024-08-01 DIAGNOSIS — F331 Major depressive disorder, recurrent, moderate: Secondary | ICD-10-CM | POA: Diagnosis not present

## 2024-08-01 DIAGNOSIS — M25512 Pain in left shoulder: Secondary | ICD-10-CM | POA: Diagnosis not present

## 2024-08-01 DIAGNOSIS — F411 Generalized anxiety disorder: Secondary | ICD-10-CM | POA: Diagnosis not present

## 2024-08-09 DIAGNOSIS — M25512 Pain in left shoulder: Secondary | ICD-10-CM | POA: Diagnosis not present

## 2024-08-11 DIAGNOSIS — M25512 Pain in left shoulder: Secondary | ICD-10-CM | POA: Diagnosis not present

## 2024-08-15 DIAGNOSIS — F411 Generalized anxiety disorder: Secondary | ICD-10-CM | POA: Diagnosis not present

## 2024-08-15 DIAGNOSIS — F331 Major depressive disorder, recurrent, moderate: Secondary | ICD-10-CM | POA: Diagnosis not present

## 2024-08-30 DIAGNOSIS — F331 Major depressive disorder, recurrent, moderate: Secondary | ICD-10-CM | POA: Diagnosis not present

## 2024-09-15 DIAGNOSIS — F331 Major depressive disorder, recurrent, moderate: Secondary | ICD-10-CM | POA: Diagnosis not present

## 2024-09-15 DIAGNOSIS — F411 Generalized anxiety disorder: Secondary | ICD-10-CM | POA: Diagnosis not present

## 2024-09-20 DIAGNOSIS — Z4789 Encounter for other orthopedic aftercare: Secondary | ICD-10-CM | POA: Diagnosis not present

## 2024-10-03 DIAGNOSIS — F411 Generalized anxiety disorder: Secondary | ICD-10-CM | POA: Diagnosis not present

## 2024-10-03 DIAGNOSIS — F331 Major depressive disorder, recurrent, moderate: Secondary | ICD-10-CM | POA: Diagnosis not present

## 2024-10-25 DIAGNOSIS — M25512 Pain in left shoulder: Secondary | ICD-10-CM | POA: Diagnosis not present

## 2024-10-25 DIAGNOSIS — M25561 Pain in right knee: Secondary | ICD-10-CM | POA: Diagnosis not present

## 2024-10-31 DIAGNOSIS — F331 Major depressive disorder, recurrent, moderate: Secondary | ICD-10-CM | POA: Diagnosis not present

## 2024-10-31 DIAGNOSIS — F411 Generalized anxiety disorder: Secondary | ICD-10-CM | POA: Diagnosis not present

## 2024-11-22 ENCOUNTER — Ambulatory Visit
Admission: RE | Admit: 2024-11-22 | Discharge: 2024-11-22 | Disposition: A | Source: Ambulatory Visit | Attending: Obstetrics and Gynecology | Admitting: Obstetrics and Gynecology

## 2024-11-22 DIAGNOSIS — Z1231 Encounter for screening mammogram for malignant neoplasm of breast: Secondary | ICD-10-CM
